# Patient Record
Sex: Female | Born: 1945 | Hispanic: Yes | Marital: Married | Smoking: Never smoker
Health system: Southern US, Community
[De-identification: ages and names within clinical notes are randomized; demographics above are authoritative.]

## PROBLEM LIST (undated history)

## (undated) DIAGNOSIS — E119 Type 2 diabetes mellitus without complications: Secondary | ICD-10-CM

## (undated) DIAGNOSIS — T7840XA Allergy, unspecified, initial encounter: Secondary | ICD-10-CM

## (undated) DIAGNOSIS — G8929 Other chronic pain: Secondary | ICD-10-CM

## (undated) DIAGNOSIS — M549 Dorsalgia, unspecified: Secondary | ICD-10-CM

## (undated) DIAGNOSIS — E785 Hyperlipidemia, unspecified: Secondary | ICD-10-CM

## (undated) DIAGNOSIS — I10 Essential (primary) hypertension: Secondary | ICD-10-CM

## (undated) DIAGNOSIS — M1990 Unspecified osteoarthritis, unspecified site: Secondary | ICD-10-CM

## (undated) HISTORY — DX: Allergy, unspecified, initial encounter: T78.40XA

## (undated) HISTORY — PX: CHOLECYSTECTOMY: SHX55

## (undated) HISTORY — DX: Hyperlipidemia, unspecified: E78.5

## (undated) HISTORY — DX: Type 2 diabetes mellitus without complications: E11.9

## (undated) HISTORY — DX: Essential (primary) hypertension: I10

## (undated) HISTORY — DX: Unspecified osteoarthritis, unspecified site: M19.90

---

## 2004-05-04 ENCOUNTER — Ambulatory Visit: Payer: Self-pay

## 2004-06-08 ENCOUNTER — Ambulatory Visit: Payer: Self-pay | Admitting: Unknown Physician Specialty

## 2011-05-29 ENCOUNTER — Ambulatory Visit: Payer: Self-pay | Admitting: Family Medicine

## 2012-06-12 ENCOUNTER — Ambulatory Visit: Payer: Self-pay | Admitting: Family Medicine

## 2012-07-01 ENCOUNTER — Ambulatory Visit: Payer: Self-pay | Admitting: Family Medicine

## 2012-10-14 ENCOUNTER — Emergency Department: Payer: Self-pay | Admitting: Emergency Medicine

## 2012-10-14 LAB — COMPREHENSIVE METABOLIC PANEL
Albumin: 3.5 g/dL (ref 3.4–5.0)
Alkaline Phosphatase: 121 U/L (ref 50–136)
Anion Gap: 5 — ABNORMAL LOW (ref 7–16)
BUN: 11 mg/dL (ref 7–18)
Bilirubin,Total: 0.3 mg/dL (ref 0.2–1.0)
Co2: 25 mmol/L (ref 21–32)
Creatinine: 0.66 mg/dL (ref 0.60–1.30)
EGFR (African American): >60
EGFR (Non-African Amer.): >60
Potassium: 4.2 mmol/L (ref 3.5–5.1)
SGOT(AST): 25 U/L (ref 15–37)
SGPT (ALT): 31 U/L (ref 12–78)

## 2012-10-14 LAB — URINALYSIS, COMPLETE
Bacteria: NONE SEEN
Bilirubin,UR: NEGATIVE
Blood: NEGATIVE
Glucose,UR: NEGATIVE mg/dL (ref 0–75)
Ph: 5 (ref 4.5–8.0)
Protein: NEGATIVE
RBC,UR: <1 /HPF (ref 0–5)
Specific Gravity: 1.006 (ref 1.003–1.030)
WBC UR: <1 /HPF (ref 0–5)

## 2012-10-14 LAB — CBC
MCH: 28.2 pg (ref 26.0–34.0)
RBC: 4.96 10*6/uL (ref 3.80–5.20)
RDW: 14.6 % — ABNORMAL HIGH (ref 11.5–14.5)

## 2013-01-07 ENCOUNTER — Ambulatory Visit: Payer: Self-pay

## 2013-04-27 ENCOUNTER — Ambulatory Visit: Payer: Self-pay | Admitting: Orthopedic Surgery

## 2013-05-13 ENCOUNTER — Ambulatory Visit: Payer: Self-pay | Admitting: Orthopedic Surgery

## 2013-05-13 LAB — CBC
HGB: 13.7 g/dL (ref 12.0–16.0)
MCV: 86 fL (ref 80–100)
Platelet: 322 10*3/uL (ref 150–440)
RBC: 4.71 10*6/uL (ref 3.80–5.20)
RDW: 14.7 % — ABNORMAL HIGH (ref 11.5–14.5)
WBC: 9.4 10*3/uL (ref 3.6–11.0)

## 2013-05-13 LAB — BASIC METABOLIC PANEL
Anion Gap: 6 — ABNORMAL LOW (ref 7–16)
BUN: 11 mg/dL (ref 7–18)
Calcium, Total: 9.2 mg/dL (ref 8.5–10.1)
Chloride: 105 mmol/L (ref 98–107)
Creatinine: 0.89 mg/dL (ref 0.60–1.30)
EGFR (African American): >60
EGFR (Non-African Amer.): >60
Osmolality: 279 (ref 275–301)

## 2013-05-13 LAB — URINALYSIS, COMPLETE
Bacteria: NONE SEEN
Bilirubin,UR: NEGATIVE
Blood: NEGATIVE
Leukocyte Esterase: NEGATIVE
Nitrite: NEGATIVE
Protein: 30
RBC,UR: <1 /HPF (ref 0–5)
Specific Gravity: 1.025 (ref 1.003–1.030)
Squamous Epithelial: 8

## 2013-05-13 LAB — SEDIMENTATION RATE: Erythrocyte Sed Rate: 6 mm/hr (ref 0–30)

## 2013-05-13 LAB — PROTIME-INR: Prothrombin Time: 13.3 secs (ref 11.5–14.7)

## 2013-05-26 ENCOUNTER — Inpatient Hospital Stay: Payer: Self-pay | Admitting: Orthopedic Surgery

## 2013-05-27 LAB — BASIC METABOLIC PANEL
Anion Gap: 3 — ABNORMAL LOW (ref 7–16)
BUN: 10 mg/dL (ref 7–18)
Calcium, Total: 8.1 mg/dL — ABNORMAL LOW (ref 8.5–10.1)
Co2: 26 mmol/L (ref 21–32)
EGFR (African American): >60
EGFR (Non-African Amer.): >60
Sodium: 136 mmol/L (ref 136–145)

## 2013-05-27 LAB — HEMOGLOBIN: HGB: 12.6 g/dL (ref 12.0–16.0)

## 2013-05-28 LAB — URINALYSIS, COMPLETE
Bacteria: NONE SEEN
Leukocyte Esterase: NEGATIVE
Nitrite: NEGATIVE
WBC UR: 1 /HPF (ref 0–5)

## 2013-05-28 LAB — PATHOLOGY REPORT

## 2013-05-30 LAB — URINE CULTURE

## 2013-06-01 ENCOUNTER — Ambulatory Visit: Payer: Self-pay | Admitting: Orthopedic Surgery

## 2014-01-07 IMAGING — CT CT OF THE RIGHT KNEE WITHOUT CONTRAST
1 of 3 series · 8 of 14 positions shown, 10 images · non-contrast
Comparison: none

REASON FOR EXAM: Right knee pain MY KNEE  Presurgical planning
COMMENTS:

PROCEDURE:     KCT - KCT KNEE RIGHT WITHOUT CONTRAST  - April 27, 2013  [DATE]
RESULT:     History: Knee pain. Preoperative evaluation.

[Series 3: knee 1.0 b30s · axial · 0.39mm/px · z∈[+368,+514]mm · 8 of 375 slices shown, 10 images]
[im 42/375  soft-tissue]
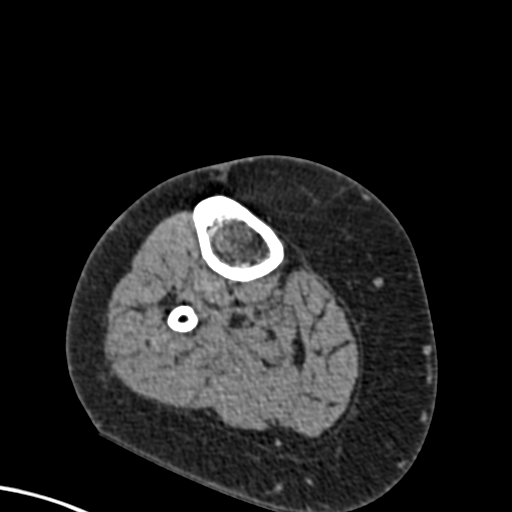
[im 42/375  bone]
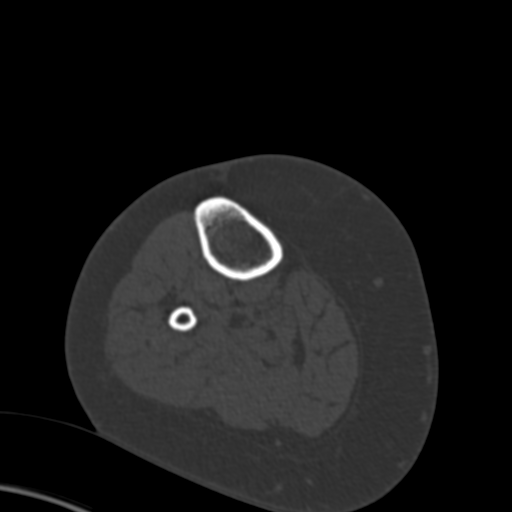
[im 84/375  bone]
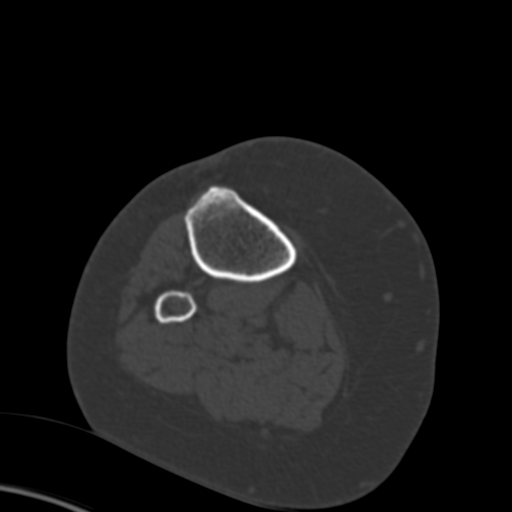
[im 125/375  bone]
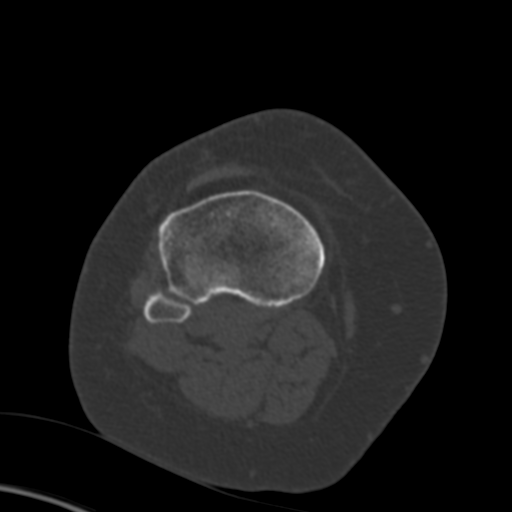
[im 167/375  bone]
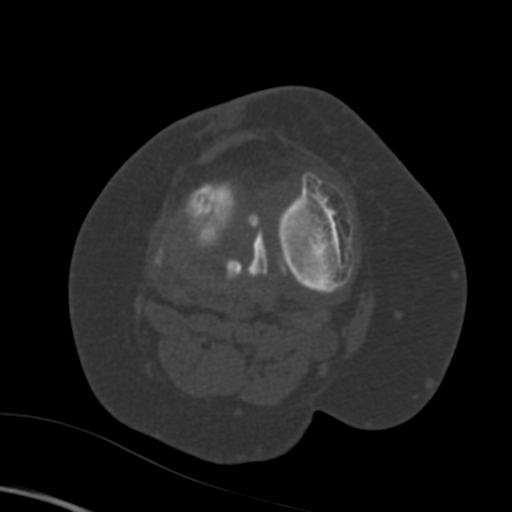
[im 208/375  soft-tissue]
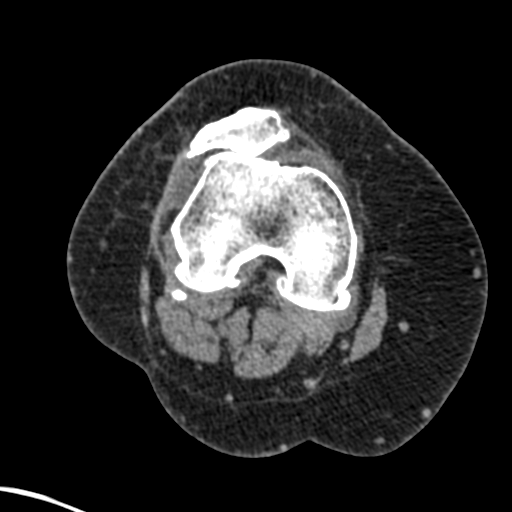
[im 208/375  bone]
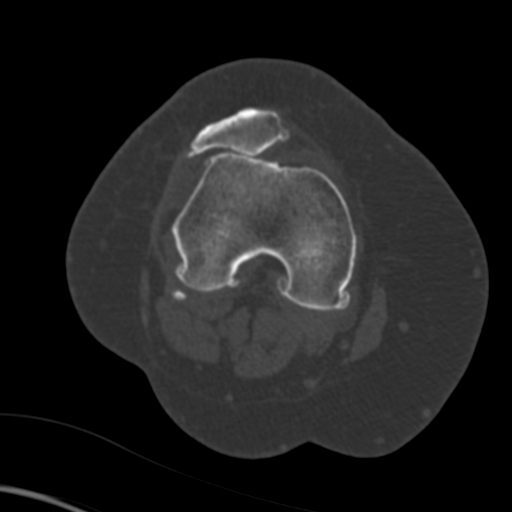
[im 250/375  bone]
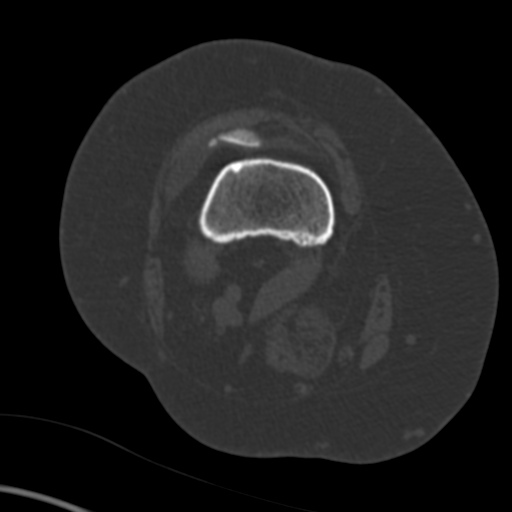
[im 291/375  bone]
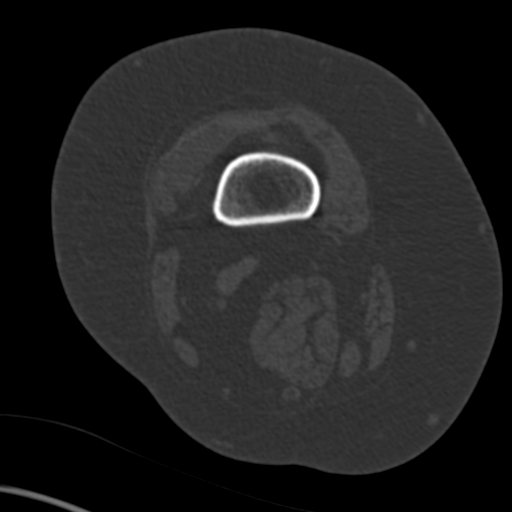
[im 333/375  bone]
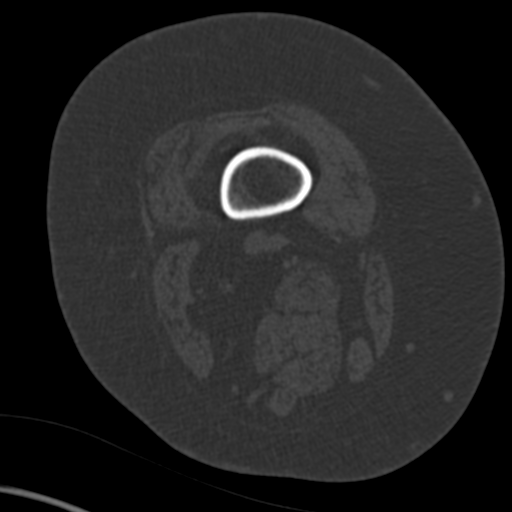

[8 of 14 positions shown; findings below may reference images not displayed]

FINDINGS: Standard preop CT right knee obtained. Lateral patellar subluxation is
present. Prominent degenerative change present. No focal bony lesion
identified.
IMPRESSION: Degenerative change right knee. No focal lesion.

## 2014-07-07 ENCOUNTER — Emergency Department: Payer: Self-pay | Admitting: Student

## 2014-07-07 LAB — COMPREHENSIVE METABOLIC PANEL
ALK PHOS: 122 U/L — AB
AST: 46 U/L — AB (ref 15–37)
Albumin: 3.4 g/dL (ref 3.4–5.0)
Anion Gap: 8 (ref 7–16)
BILIRUBIN TOTAL: 0.3 mg/dL (ref 0.2–1.0)
BUN: 10 mg/dL (ref 7–18)
CALCIUM: 9 mg/dL (ref 8.5–10.1)
CREATININE: 0.64 mg/dL (ref 0.60–1.30)
Chloride: 106 mmol/L (ref 98–107)
Co2: 25 mmol/L (ref 21–32)
EGFR (African American): >60
EGFR (Non-African Amer.): >60
GLUCOSE: 100 mg/dL — AB (ref 65–99)
Osmolality: 277 (ref 275–301)
Potassium: 4 mmol/L (ref 3.5–5.1)
SGPT (ALT): 53 U/L
SODIUM: 139 mmol/L (ref 136–145)
Total Protein: 7.9 g/dL (ref 6.4–8.2)

## 2014-07-07 LAB — CBC WITH DIFFERENTIAL/PLATELET
BASOS ABS: 0.1 10*3/uL (ref 0.0–0.1)
Basophil %: 0.6 %
EOS PCT: 2.7 %
Eosinophil #: 0.3 10*3/uL (ref 0.0–0.7)
HCT: 42.5 % (ref 35.0–47.0)
HGB: 13.7 g/dL (ref 12.0–16.0)
LYMPHS PCT: 30.8 %
Lymphocyte #: 3.2 10*3/uL (ref 1.0–3.6)
MCH: 28 pg (ref 26.0–34.0)
MCHC: 32.2 g/dL (ref 32.0–36.0)
MCV: 87 fL (ref 80–100)
Monocyte #: 0.7 x10 3/mm (ref 0.2–0.9)
Monocyte %: 6.6 %
Neutrophil #: 6.2 10*3/uL (ref 1.4–6.5)
Neutrophil %: 59.3 %
PLATELETS: 339 10*3/uL (ref 150–440)
RBC: 4.9 10*6/uL (ref 3.80–5.20)
RDW: 14.3 % (ref 11.5–14.5)
WBC: 10.4 10*3/uL (ref 3.6–11.0)

## 2014-07-07 LAB — HEMOGLOBIN A1C: Hemoglobin A1C: 7.1 % — ABNORMAL HIGH (ref 4.2–6.3)

## 2014-07-18 ENCOUNTER — Emergency Department: Payer: Self-pay | Admitting: Emergency Medicine

## 2014-10-26 ENCOUNTER — Ambulatory Visit
Admit: 2014-10-26 | Disposition: A | Discharge status: Home / Self Care | Payer: Self-pay | Attending: Orthopedic Surgery | Admitting: Orthopedic Surgery

## 2014-10-26 LAB — URINALYSIS, COMPLETE
BLOOD: NEGATIVE
Bilirubin,UR: NEGATIVE
Glucose,UR: NEGATIVE mg/dL (ref 0–75)
Ketone: NEGATIVE
LEUKOCYTE ESTERASE: NEGATIVE
NITRITE: NEGATIVE
PROTEIN: NEGATIVE
Ph: 5 (ref 4.5–8.0)
RBC,UR: 1 /HPF (ref 0–5)
SPECIFIC GRAVITY: 1.021 (ref 1.003–1.030)
Squamous Epithelial: 6

## 2014-10-26 LAB — BASIC METABOLIC PANEL
Anion Gap: 9 (ref 7–16)
BUN: 17 mg/dL
Calcium, Total: 9.3 mg/dL
Chloride: 102 mmol/L
Co2: 27 mmol/L
Creatinine: 0.53 mg/dL
EGFR (African American): >60
EGFR (Non-African Amer.): >60
Glucose: 123 mg/dL — ABNORMAL HIGH
Potassium: 3.9 mmol/L
Sodium: 138 mmol/L

## 2014-10-26 LAB — MRSA PCR SCREENING

## 2014-10-26 LAB — CBC
HCT: 41.4 % (ref 35.0–47.0)
HGB: 13.5 g/dL (ref 12.0–16.0)
MCH: 28.1 pg (ref 26.0–34.0)
MCHC: 32.7 g/dL (ref 32.0–36.0)
MCV: 86 fL (ref 80–100)
Platelet: 319 10*3/uL (ref 150–440)
RBC: 4.82 10*6/uL (ref 3.80–5.20)
RDW: 15 % — AB (ref 11.5–14.5)
WBC: 9.8 10*3/uL (ref 3.6–11.0)

## 2014-10-26 LAB — PROTIME-INR
INR: 1.1
Prothrombin Time: 13.9 secs

## 2014-10-26 LAB — APTT: Activated PTT: 33.8 secs (ref 23.6–35.9)

## 2014-10-26 LAB — SEDIMENTATION RATE: Erythrocyte Sed Rate: 16 mm/hr (ref 0–30)

## 2014-10-28 LAB — URINE CULTURE

## 2014-11-02 ENCOUNTER — Inpatient Hospital Stay
Admit: 2014-11-02 | Disposition: A | Discharge status: Home / Self Care | Payer: Self-pay | Attending: Orthopedic Surgery | Admitting: Orthopedic Surgery

## 2014-11-02 HISTORY — PX: JOINT REPLACEMENT: SHX530

## 2014-11-03 LAB — BASIC METABOLIC PANEL
Anion Gap: 3 — ABNORMAL LOW (ref 7–16)
BUN: 9 mg/dL
CREATININE: 0.55 mg/dL
Calcium, Total: 8.2 mg/dL — ABNORMAL LOW
Chloride: 105 mmol/L
Co2: 26 mmol/L
EGFR (African American): >60
EGFR (Non-African Amer.): >60
GLUCOSE: 183 mg/dL — AB
Potassium: 3.7 mmol/L
SODIUM: 134 mmol/L — AB

## 2014-11-03 LAB — PLATELET COUNT: PLATELETS: 296 10*3/uL (ref 150–440)

## 2014-11-03 LAB — HEMOGLOBIN: HGB: 12.6 g/dL (ref 12.0–16.0)

## 2014-11-06 LAB — WOUND CULTURE

## 2014-11-12 NOTE — Op Note (Signed)
PATIENT NAME:  Hayley Mitchell, Hayley Mitchell MR#:  [id number] DATE OF BIRTH:  [birth year]  DATE OF PROCEDURE:  05/26/2013  PREOPERATIVE DIAGNOSIS: Right knee severe osteoarthritis.   POSTOPERATIVE DIAGNOSIS: Right knee severe osteoarthritis.   PROCEDURE: Right total knee replacement.   SURGEON: Kennedy BuckerMichael Velmer Woelfel, MD  ASSISTANT: Cranston Neighborhris Gaines, PA-Mitchell  DESCRIPTION OF PROCEDURE: The patient was brought to the Operating Room, and after adequate spinal anesthesia was obtained, the right leg was prepped and draped in the usual sterile fashion, with a tourniquet applied to the upper thigh. After patient identification and timeout procedures were completed, the leg was prepped and draped in the usual sterile fashion, appropriate patient identification, timeout procedures were completed, the leg was exsanguinated with an Esmarch, and the tourniquet raised to 300 mmHg. A midline skin incision was made, followed by a medial parapatellar arthrotomy. Inspection reveals severe degenerative changes throughout the knee, particularly medially, with exposed bone on both femoral and tibial condyles, as well as extensive eburnation of the patella, with significant bone loss. The ACL and fat pad were excised. The proximal tibia was exposed to allow for application of the Medacta cutting block. Cutting block applied and proximal tibia cut carried out. The identical procedure was carried out on the femoral side with the Medacta femoral cutting guide applied, and distal femoral cut made. A size 2 cutting guide was then placed on the distal femur. Anterior, posterior and chamfer cuts made. The size 2 tibia trial was placed after excising the posterior horns of the menisci and pinned in place. A proximal drill hole made along with the keel punch, which was left in place for trialing. The 2 femur was impacted into place with distal femoral drill holes made. The 10 mm insert gave good stability throughout a range of motion. The trials were removed,  and the guide for the trochlear cut was then placed and the trochlear cut made in the distal femur. The patella was cut using the patellar cutting guide. It was quite small, so a round drill guide was used for a 24 mm patella. This was drilled and trial fit well. At this point, the tourniquet was let down and hemostasis checked with electrocautery. The periarticular tissue was infiltrated with a combination of Marcaine with morphine as well as a dilute Exparel around the joint. The knee was thoroughly irrigated after raising the tourniquet. After the bony surfaces were dried, the tibial component was cemented into place first, with excess cement removed. The femoral component cemented into place with a 10 mm trial placed, followed by cementing the patellar component. After all the cement had set, the excess cement was removed and the 10 mm final implant was placed. The patella tracked well, with no touch technique. Tourniquet was let down and the knee again thoroughly irrigated. The arthrotomy was repaired using a heavy quill suture, 2-0 quill subcutaneously,  skin staples, Xeroform, 4 x 4's, ABD, Webril, and Ace wrap, along with Polar Care and knee immobilizer.   ESTIMATED BLOOD LOSS: 100.  COMPLICATIONS:  None.   SPECIMEN:  Cut ends of bone.   TOURNIQUET TIME:  65 minutes at 300 mmHg.  IMPLANTS:  Medacta GMK sphere primary size 2 right, tibial tray size 2 right, a 2 right 10 mm flex tibial insert, and a 24 mm inset patella.     ____________________________ Leitha SchullerMichael J. Adlai Sinning, MD mjm:mr D: 05/26/2013 19:36:40 ET T: 05/26/2013 19:51:24 ET JOB#: [id number]  cc: Leitha SchullerMichael J. Jenica Costilow, MD, <Dictator> Leitha SchullerMICHAEL J Braydyn Schultes MD ELECTRONICALLY SIGNED 05/27/2013  7:47 

## 2014-11-12 NOTE — Discharge Summary (Signed)
PATIENT NAME:  Hayley Mitchell, Hayley Mitchell MR#:  [id number] DATE OF BIRTH:  January 06, 1946  DATE OF ADMISSION:  05/26/2013 DATE OF DISCHARGE:  05/30/2013   ADMITTING DIAGNOSIS: Right knee severe osteoarthritis.   DISCHARGE DIAGNOSIS: Right knee severe osteoarthritis.   PROCEDURE: Right total knee replacement.   SURGEON: Leitha SchullerMichael J. Menz, M.D.   ASSISTANT: Cranston Neighborhris Yarimar Lavis, PA-Mitchell.   ESTIMATED BLOOD LOSS: 100 mL.   COMPLICATIONS: None.   SPECIMEN: Cut ends of bone.   TOURNIQUET TIME: 65 minutes at 300 mmHg.  IMPLANTS: Medacta GMK Sphere Primary size 2 right, tibial tray size 2 right,  a 2 right 10 mm flexible tibial insert, and a 24 mm insert patella.   CONDITION: To recovery room stable.   HISTORY: The patient is a 69 year old female with severe right knee osteoarthritis. She has had extensive nonoperative treatment that has failed. She has pain at night and difficulty with doing activities of daily living such as going to the grocery store. Her MyKnee CT was reviewed as well as our plan with interpreter present.   PHYSICAL EXAMINATION: LUNGS: Clear.  HEART: Regular rate and rhythm.  HEENT: On exam today, she has partial upper dentures.  RIGHT KNEE: On examination of right knee, the patient has 5 to 110 degrees of range of motion with a large Baker's cyst present. She has crepitation laterally and medially as well as patellofemoral joint pain. Distally, she was neurovascularly intact with trace edema, palpable dorsalis pedis pulse and posterior tibialis pulse. Able to flex and extend the toes.   HOSPITAL COURSE: The patient was admitted to the hospital on 05/26/2013. She had surgery that same day and was brought to the orthopedic floor from the PACU in stable condition. On postop day 1, the patient was found to have severe pain and moderate nausea. She had vomited a few times throughout the night. She was started on Zofran as well as Reglan and increased her pain medication by adding Ultram 50 mg 1 to 2  tablets every 4 to 6 hours as needed for pain. On postop day 2, the patient's pain and nausea were significantly improved. She was able to tolerate p.o. She was doing well. On postop day 3, the patient was doing much better as far as her pain. She was tolerating p.o. well. Vital signs and labs were stable. She was progressing very well with physical therapy by postop day 3 and was able to ambulate 150 feet as well as a few stairs. She had had a bowel movement. On postoperative day 4, the patient was stable and ready for discharge to home with home health PT.    CONDITION AT DISCHARGE: Stable.   DISCHARGE INSTRUCTIONS: The patient may gradually increase weight-bearing on the affected extremity. She is to elevate the affected foot on 1 or 2 pillows with the foot higher than the knee. Thigh-high TED hose on both legs and remove at bedtime and replace on arising the next morning. Elevate the heels off the bed. Use incentive spirometry every hour while awake. Encourage cough and deep breathing. No concentrated sweets or sugar. Continue using Polar Care unit, maintaining a temperature between 40 and 50 degrees. Do not get the dressing or bandage wet or dirty. [facility] if the dressing gets water under it. Leave the dressing on. Call [facility] if any of the following occur: Bright red bleeding from the incision or wound, fever above 101.5 degrees, redness, swelling or drainage at the incision. [facility] if you experience any  increased leg pain, numbness or weakness in your legs, bowel or bladder symptoms. Home physical therapy has been arranged for continuation of rehab program. Please call [facility] if a therapist has not contacted you within 48 hours of your return home. Call [facility] for followup appointment.   DISCHARGE MEDICATIONS: Duloxetine 30 mg oral delayed-release capsule 1 cap orally once a day in the evening, metformin 500 mg oral tablet 1 tablet orally 2 times a day, Tylenol 500 mg oral tablet 1  tablet orally every 4 hours as needed for pain or temperature greater than 100.4, oxycodone 5 mg 1 tablet orally every 4 hours as needed for pain, tramadol 50 mg 1 tablet orally every 6 hours as needed for pain, magnesium hydroxide 8% oral suspension 30 mL orally 2 times a day as needed for constipation, milk of magnesia 30 mL orally every 6 hours as needed for indigestion or heartburn, Lovenox 40 mg subcutaneous once a day for 14 days, ondansetron 4 mg oral tablet 1 tablet orally every 6 hours as needed for nausea or vomiting, aspirin 81 mg oral tablet chewable 1 tablet orally once a day, bisacodyl 10 mg rectal suppository 1 suppository rectally once a day as needed for constipation, docusate/senna 50 mg/8.6 mg oral tablet 1 tablet orally 2 times a day.   ____________________________ T. Cranston Neighbor, PA-Mitchell tcg:jcm D: 05/29/2013 13:38:41 ET T: 05/29/2013 14:02:07 ET JOB#: [id number]  cc: T. Cranston Neighbor, PA-Mitchell, <Dictator> Evon Slack [place] ELECTRONICALLY SIGNED 06/11/2013 13:17

## 2014-11-21 NOTE — Op Note (Signed)
PATIENT NAME:  Hayley Mitchell, Hayley Mitchell MR#:  [id number] DATE OF BIRTH:  16-Aug-1945  DATE OF PROCEDURE:  11/02/2014  PREOPERATIVE DIAGNOSIS:  Unstable right total knee.   POSTOPERATIVE DIAGNOSIS:  Unstable right total knee.   PROCEDURE:  Revision polyethylene insert, right total knee.   ANESTHESIA:  Spinal.   SURGEON:  Leitha SchullerMichael J. Kathren Scearce, MD   ASSISTANT:  Cranston Neighborhris Gaines, PA-Mitchell   DESCRIPTION OF PROCEDURE:  The patient was brought to the operating room, and after adequate spinal anesthesia was obtained, the right leg was prepped and draped in the usual sterile fashion with a bump underneath the right buttock to internally rotate the leg. After prepping and draping in the usual sterile fashion, appropriate patient identification and timeout procedures were completed. The tourniquet was raised to 300 mmHg, and a midline skin incision was made followed by a medial parapatellar arthrotomy. Culture was obtained at this point. Fluid was clear. The medial and lateral gutters were checked, and there was no significant synovitis. The scar behind the patellar tendon was removed to adequately mobilize the anterior knee and expose the prior tibial insert. There was stability in mid flexion and extension to varus and valgus. The screw was removed and the implant then removed with the use of a small osteotome. There was some wear on the medial compartment consistent with instability. Trialing with different sized inserts was made, and a 14 mm insert gave excellent stability in flexion, extension, and full extension. This was chosen as the final insert and this was placed and set screw inserted. The knee was then infiltrated with Exparel and irrigated with first dilute Betadine solution and then pulsatile lavage. The arthrotomy was repaired using Ethibond adjacent to the patella and then a heavy quill suture, 2-0 quill subcutaneously, and skin staples. Xeroform, 4 x 4's, ABD, Webril and Ace wrap were applied along with a Polar  Care device. The patient was sent to the recovery room in stable condition.   TOURNIQUET TIME:  35 minutes.   ESTIMATED BLOOD LOSS:  25.   COMPLICATIONS:  None.   SPECIMEN:  Culture.  CONDITION TO RECOVERY ROOM:  Stable.    ____________________________ Leitha SchullerMichael J. Thierry Dobosz, MD mjm:nb D: 11/02/2014 20:33:41 ET T: 11/03/2014 01:17:22 ET JOB#: [id number]  cc: Leitha SchullerMichael J. Kaitlynne Wenz, MD, <Dictator> Leitha SchullerMICHAEL J Sameen Leas MD ELECTRONICALLY SIGNED 11/03/2014 8:37

## 2014-11-21 NOTE — Discharge Summary (Signed)
PATIENT NAME:  Hayley Mitchell, Hayley Mitchell MR#:  [id number] DATE OF BIRTH:  [birth year]  DATE OF ADMISSION:  11/02/2014 DATE OF DISCHARGE:  11/05/2014  ADMITTING DIAGNOSIS: Unstable right total knee.   DISCHARGE DIAGNOSIS: Unstable right total knee.   PROCEDURE: Revision of polyethylene insert, right total knee.   ANESTHESIA: Spinal.   SURGEON: Leitha Schuller, MD   ASSISTANT: Lollie Marrow, PA-C.   ESTIMATED BLOOD LOSS: 25 mL.   COMPLICATIONS: None.   SPECIMEN: Culture.   CONDITION: To recovery room stable.   HISTORY OF PRESENT ILLNESS: The patient is a 69 year old female seen for evaluation of right total knee, complains of pain and instability. Revision procedure was performed 05/26/2013. She did not have any wound healing problems. She did have a pain-free period after the replacement. Symptoms started several months ago. Symptoms are gradual in onset. She has had no new injury or accident. She reports not 9/10 pain. Pain is better with nonsteroidal anti-inflammatory drugs. The pain is located lateral and medially. The pain is described as aching. The symptoms are aggravated with normal daily activities. She has mechanical symptoms. She has associated swelling and tenderness. She has tried nonsteroidal anti-inflammatory drugs, ibuprofen.  PHYSICAL EXAMINATION: GENERAL: No apparent distress, well nourished, well developed, obese.  EYES: Pupils equal, round, and reactive, synchronous movement.  LYMPHATIC: No palpable adenopathy.  RESPIRATORY: Nonlabored breathing.  LUNGS: Clear to auscultation.  VASCULAR: No edema.  NEUROLOGIC: Normal mood and affect.  MUSCULOSKELETAL: Normal, except as noted in right knee exam.  HEART: Regular rate and rhythm.  HEENT: Normal.  RIGHT KNEE: Shows the patient has midline incision that is healed with no signs of infection. She has mild swelling, but no warmth or erythema. She is tender along the medial joint line and lateral joint line. Range of motion is  full. She has grade 2 pain with varus and valgus stress testing.  NEUROLOGICAL: Normal.  VASCULAR: Normal.   HOSPITAL COURSE: The patient was admitted to the hospital on 11/02/2014. She had surgery that same day and was brought to the orthopedic floor from the PACU in stable condition. On postoperative day 1, hemoglobin was normal at 12.6. Other labs and vital signs were stable and normal. She was having moderate pain and slow progress in physical therapy. On postoperative day 2, she did slightly progress therapy and was able to stand and ambulate to the chair. Pain continued to be moderate. By postoperative day 3, the patient made minimal improvements with physical therapy, but was very insistent on going home with home health physical therapy. Daughters were also insistent that the patient return home, as she had numerous caretakers at home that could help assist her. On postoperative day 3, the patient was stable and ready for discharge to home with home health physical therapy.   CONDITION AT DISCHARGE: Stable.   DISCHARGE INSTRUCTIONS: The patient may gradually increase weight-bearing on the affected extremity. Elevate the affected foot or leg on 1 or 2 pillows with the foot higher than the knee. Thigh-high TED hose on both legs and remove at bedtime, replace on arising the next morning. Elevate the heels off the bed. Incentive spirometer every hour while awake. Encourage cough and deep breathing. The patient may resume a regular diet as tolerated. Continue using Polar Care unit, maintaining temperature between 40 and 50 degrees. Do not get the dressing or bandage wet or dirty. Call [facility] if the dressing gets water under it. Leave the dressing on. Call [facility] if  any of the following occur: Bright red bleeding from the incisional wound, fever above 105 degrees, redness, swelling, or drainage at the incisions. Call [facility] if you experience  any increased leg pain, numbness, or weakness in the legs, or bowel or bladder symptoms. Home physical therapy has been arranged for continuation of rehabilitation program. Please call [facility] if a therapist has not contacted you within 48 hours of your return home. [facility] for follow-up appointment. [facility].   DISCHARGE MEDICATIONS: Please see list of discharge medications from discharge instructions.     ____________________________ T. Cranston Neighborhris Gaines, PA-C tcg:mw D: 11/05/2014 08:28:31 ET T: 11/05/2014 14:20:17 ET JOB#: [id number]  cc: T. Cranston Neighborhris Gaines, PA-C, <Dictator> Evon SlackHOMAS C GAINES [place] ELECTRONICALLY SIGNED 11/18/2014 14:18

## 2014-12-06 ENCOUNTER — Ambulatory Visit: Payer: Medicare Other | Attending: Orthopedic Surgery

## 2014-12-06 DIAGNOSIS — R269 Unspecified abnormalities of gait and mobility: Secondary | ICD-10-CM | POA: Diagnosis not present

## 2014-12-06 DIAGNOSIS — M25561 Pain in right knee: Secondary | ICD-10-CM | POA: Diagnosis not present

## 2014-12-06 NOTE — Therapy (Signed)
[facility] MAIN [facility] SERVICES [address] [place], [place], [address] Phone: [phone]   Fax:  [phone]  Physical Therapy Evaluation  Patient Details  Name: Hayley Mitchell MRN: [id number] Date of Birth: 16-Oct-1945 Referring Provider:  Kennedy Bucker, MD  Encounter Date: 12/06/2014      PT End of Session - 12/06/14 1250    Visit Number 1   Number of Visits 9   Date for PT Re-Evaluation 01/03/15   PT Start Time 1105   PT Stop Time 1155   PT Time Calculation (min) 50 min   Equipment Utilized During Treatment Gait belt   Activity Tolerance Patient limited by pain   Behavior During Therapy [facility] for tasks assessed/performed      Past Medical History  Diagnosis Date  . Allergy     pt denies any significant medical history to herself or family, no meds.    Past Surgical History  Procedure Laterality Date  . Cholecystectomy    . Joint replacement Left November 02, 2014    There were no vitals filed for this visit.  Visit Diagnosis:  Right knee pain - Plan: PT plan of care cert/re-cert  Abnormality of gait - Plan: PT plan of care cert/re-cert      Subjective Assessment - 12/06/14 1115    Subjective pt reports having an original surgery R TKA Jun 05 2013, she reports at first it was doing good, then she started to notice pain about 6 months ago, walking was unstable and hurting. she went back to the Dr. Rosita Kea where he preformed a second TKA April. 18 2016. pt reports going home after surgery and  recieving home health PT for 6 visits. she reports she went to outpatient PT at [facility] for 2 visits, but then got a phone call to come to outpatient [facility] PT.    Patient Stated Goals reduce pain and return to walking without pain   Currently in Pain? Yes   Pain Score 5    Pain Location Knee   Pain Orientation Right   Pain Descriptors / Indicators Aching   Aggravating Factors  standing / walking   Pain Relieving Factors meds, ice              [facility] PT Assessment - 12/06/14 1243    Assessment   Medical Diagnosis R TKA   Onset Date 11/08/14   Prior Therapy HH PT   Precautions   Precautions None   Restrictions   Weight Bearing Restrictions No   Balance Screen   Has the patient fallen in the past 6 months No   Has the patient had a decrease in activity level because of a fear of falling?  Yes   Is the patient reluctant to leave their home because of a fear of falling?  No   Home Environment   Living Enviornment Private residence   Living Arrangements Children   Home Access Stairs to enter   Entrance Stairs-Number of Steps 3   Entrance Stairs-Rails Can reach both   Home Equipment Walker - 2 wheels;Tub bench;Grab bars - tub/shower;Cane - single point   Prior Function   Level of Independence Independent with basic ADLs;Independent with homemaking with ambulation   Observation/Other Assessments   Observations in no acute distress, pt is obese   Skin Integrity well healing scar   Sensation   Light Touch --  reduced sensation R lateral knee   ROM / Strength   AROM / PROM /  Strength AROM;Strength   AROM   Overall AROM  Deficits   Overall AROM Comments --  L knee 0-120 deg, R knee 0-110 deg   Strength   Overall Strength Deficits   Overall Strength Comments L hip flexion 4/5, knee ext 4+/5, hamstrings 4+/5, hip abd 4/5, hip adduction 4-/5. R hip flexion 3+/5, knee ext 4-/5, hamstrsings 4/5, hip abd 4-/5, hip adduction 4-/5   Bed Mobility   Bed Mobility --  WNL   Transfers   Transfers --  independent   Ambulation/Gait   Ambulation/Gait Yes   Ambulation/Gait Assistance 6: Modified independent (Device/Increase time)   Ambulation Distance (Feet) --  3626m   Assistive device Straight cane   Gait Pattern Step-through pattern;Decreased step length - right;Decreased step length - left;Decreased stance time - right  antalgic R, pt orig. used cane on R side, educated to use L   Ambulation Surface Level   Gait  velocity 0.23 m/s                           PT Education - 12/06/14 1250    Education provided Yes   Education Details educated to continue HEP, she has been compliant. use ice as needed.    Person(s) Educated Patient   Methods Explanation   Comprehension Verbalized understanding             PT Long Term Goals - 12/06/14 1407    PT LONG TERM GOAL #1   Title pt will ascend/descend 4 steps in step over step pattern using 1 handrail demonstrating improved RLE strength.    Time 4   Period Weeks   Status New   PT LONG TERM GOAL #2   Title pt will squat to pick up item from the ground 3x demonstrating return to ADLs.    Time 4   Period Weeks   Status New   PT LONG TERM GOAL #3   Title pt will increase gait speed to 1.3832m/s demonstrating normalized home mobility.    Time 4   Period Weeks   Status New   PT LONG TERM GOAL #4   Title pt will ambulate 5500ft with LRAD without rest demonstrating community mobility.    Time 4   Period Weeks   Status New               Plan - 12/06/14 1251    Clinical Impression Statement pt presents doing well s/p R TKA preformed 11/08/14. pt does c/o post-op pain, but is also out of pain meds, instructed pt to call MD for refill. pt demonstrates good post-op ROM, but demonstrates reduced strength, reduced mobility, impaired gait, pain secondary to her TKA.    Pt will benefit from skilled therapeutic intervention in order to improve on the following deficits Abnormal gait;Decreased activity tolerance;Decreased balance;Decreased mobility;Decreased strength;Pain;Decreased scar mobility;Decreased knowledge of use of DME;Difficulty walking;Decreased range of motion;Impaired flexibility   Rehab Potential Good   PT Frequency 2x / week   PT Duration 4 weeks   PT Treatment/Interventions DME Instruction;Balance training;Manual techniques;Therapeutic exercise;Electrical Stimulation;Cryotherapy;Therapeutic activities;Dry needling;Passive  range of motion;Patient/family education;Scar mobilization;Ultrasound;ADLs/Self Care Home Management;Gait training;Neuromuscular re-education   PT Next Visit Plan 6 min walk          G-Codes - 12/06/14 1411    Functional Limitation Mobility: Walking and moving around   Mobility: Walking and Moving Around Current Status ([id number]) At least 40 percent but less than 60 percent impaired, limited or restricted  Mobility: Walking and Moving Around Goal Status [phone]) At least 1 percent but less than 20 percent impaired, limited or restricted       Problem List There are no active problems to display for this patient.  Carlyon ShadowAshley C. Sherrye Puga, PT, DPT [phone]  Taheem Fricke 12/06/2014, 2:14 PM  [facility] MAIN [facility] [address] [place], [place], [address] Phone: [phone]   Fax:  [phone]

## 2014-12-09 ENCOUNTER — Ambulatory Visit: Payer: Medicare Other

## 2014-12-09 DIAGNOSIS — M25561 Pain in right knee: Secondary | ICD-10-CM | POA: Diagnosis not present

## 2014-12-09 DIAGNOSIS — R269 Unspecified abnormalities of gait and mobility: Secondary | ICD-10-CM

## 2014-12-09 NOTE — Therapy (Signed)
[facility] [address] [place], [place], [address] Phone: [phone]   Fax:  [phone]  Physical Therapy Treatment  Patient Details  Name: Johnna AcostaMaria C Hegarty MRN: [id number] Date of Birth: 1946/04/02 Referring Provider:  Kennedy BuckerMenz, Michael, MD  Encounter Date: 12/09/2014      PT End of Session - 12/09/14 1242    Visit Number 2   Number of Visits 9   Date for PT Re-Evaluation 01/03/15   PT Start Time 1105   PT Stop Time 1155   PT Time Calculation (min) 50 min   Equipment Utilized During Treatment Gait belt   Activity Tolerance Patient tolerated treatment well;Patient limited by pain  pt required multiple rest breaks   Behavior During Therapy [facility] for tasks assessed/performed      Past Medical History  Diagnosis Date  . Allergy     pt denies any significant medical history to herself or family, no meds.    Past Surgical History  Procedure Laterality Date  . Cholecystectomy    . Joint replacement Left November 02, 2014    There were no vitals filed for this visit.  Visit Diagnosis:  Right knee pain  Abnormality of gait      Subjective Assessment - 12/09/14 1234    Subjective pt reports she is doing better. pt reports her L knee holds her back sometimes. pt reports she is compliant with HEP. pt reports she left a message with MD to refill pain meds and has not heard back yet.   Patient is accompained by: Family member;Interpreter  granddaughter and interpreter   Patient Stated Goals reduce pain and return to walking without pain   Currently in Pain? Yes   Pain Score 5    Pain Location Knee   Pain Orientation Right;Left   Pain Descriptors / Indicators Aching            [facility] PT Assessment - 12/09/14 0001    6 Minute Walk- Baseline   6 Minute Walk- Baseline yes  38925ft                     [facility] Adult PT Treatment/Exercise - 12/09/14 0001    Ambulation/Gait   Ambulation/Gait Yes    Ambulation/Gait Assistance 6: Modified independent (Device/Increase time);5: Supervision   Ambulation/Gait Assistance Details --  38525ft for 6 min walk   Assistive device Straight cane   Gait Pattern Step-through pattern   Ambulation Surface Level   Gait Comments --  antalgic on the L>R   High Level Balance   High Level Balance Comments standing Narrow BOS 30s x 5 with eyes closed   Exercises   Exercises Knee/Hip   Knee/Hip Exercises: Standing   Forward Step Up 10 reps;Hand Hold: 2;Step Height: 6";Right   Functional Squat 2 sets;10 reps   Other Standing Knee Exercises --  4 way hip x 10 each way bilaterally open hand hold   Knee/Hip Exercises: Seated   Long Arc Quad AROM;Right;Left;2 sets;10 reps   Other Seated Knee Exercises --  seated march x 20   Other Seated Knee Exercises --  Nustep x 2 min no charge warm up   Modalities   Modalities Cryotherapy   Cryotherapy   Number Minutes Cryotherapy 10 Minutes   Cryotherapy Location Knee  bilateral   Type of Cryotherapy Ice pack                PT Education - 12/09/14 1242  Education provided Yes   Education Details prognosis, expected pain post-op   Person(s) Educated Patient   Methods Explanation   Comprehension Verbalized understanding             PT Long Term Goals - 12/09/14 1244    PT LONG TERM GOAL #1   Title pt will ascend/descend 4 steps in step over step pattern using 1 handrail demonstrating improved RLE strength.    Time 4   Period Weeks   Status New   PT LONG TERM GOAL #2   Title pt will squat to pick up item from the ground 3x demonstrating return to ADLs.    Time 4   Period Weeks   Status New   PT LONG TERM GOAL #3   Title pt will increase gait speed to 1.7268m/s demonstrating normalized home mobility.    Time 4   Period Weeks   Status New   PT LONG TERM GOAL #4   Title pt will ambulate 57100ft with LRAD without rest demonstrating community mobility.    Time 4   Period Weeks   Status New                Plan - 12/09/14 1243    Clinical Impression Statement pt did fair with progression of activity. pt has good motivation, but is limited by pain needin multiple rest breaks. pt reports her L knee is as painful as the R. 36925ft on 6 min walk test today- goal for 59300ft in 4 weeks for community mobility. pt had good pain relief with ice following therex.    Pt will benefit from skilled therapeutic intervention in order to improve on the following deficits Abnormal gait;Decreased activity tolerance;Decreased balance;Decreased mobility;Decreased strength;Pain;Decreased scar mobility;Decreased knowledge of use of DME;Difficulty walking;Decreased range of motion;Impaired flexibility   Rehab Potential Good   PT Frequency 2x / week   PT Duration 4 weeks   PT Treatment/Interventions DME Instruction;Balance training;Manual techniques;Therapeutic exercise;Electrical Stimulation;Cryotherapy;Therapeutic activities;Dry needling;Passive range of motion;Patient/family education;Scar mobilization;Ultrasound;ADLs/Self Care Home Management;Gait training;Neuromuscular re-education   PT Next Visit Plan progress strengthening.         Problem List There are no active problems to display for this patient.  Carlyon ShadowAshley C. Kyvon Hu, PT, DPT [phone]#[id number]  Aikam Vinje 12/09/2014, 12:56 PM  [facility] [address] [place], [place], [address] Phone: [phone]   Fax:  [phone]

## 2014-12-13 ENCOUNTER — Ambulatory Visit: Payer: Medicare Other

## 2014-12-13 DIAGNOSIS — M25561 Pain in right knee: Secondary | ICD-10-CM

## 2014-12-13 DIAGNOSIS — R269 Unspecified abnormalities of gait and mobility: Secondary | ICD-10-CM

## 2014-12-13 NOTE — Therapy (Signed)
[place] [address] [place], [place], [address] Phone: [phone]   Fax:  [phone]  Physical Therapy Treatment  Patient Details  Name: Hayley AcostaMaria C Mitchell MRN: [id number] Date of Birth: 04-08-1946 Referring Provider:  Kennedy BuckerMenz, Michael, MD  Encounter Date: 12/13/2014      PT End of Session - 12/13/14 1335    Visit Number 3   Number of Visits 9   Date for PT Re-Evaluation 01/03/15   PT Start Time 1120   PT Stop Time 1208   PT Time Calculation (min) 48 min   Equipment Utilized During Treatment Gait belt   Activity Tolerance Patient tolerated treatment well;Patient limited by pain  pt required multiple rest breaks   Behavior During Therapy [facility] for tasks assessed/performed      Past Medical History  Diagnosis Date  . Allergy     pt denies any significant medical history to herself or family, no meds.    Past Surgical History  Procedure Laterality Date  . Cholecystectomy    . Joint replacement Left November 02, 2014    There were no vitals filed for this visit.  Visit Diagnosis:  Right knee pain  Abnormality of gait      Subjective Assessment - 12/13/14 1325    Subjective pt reports she was sore after last visit, but she is doing all she can at home. pt reports she still does not have pain meds and is only taking Ibuprophen as the MD office has not returned her call. pt reports she she pleased to report she walked down to PT today instead of using a wheelchair.    Patient is accompained by: Family member;Interpreter  granddaughter and interpreter   Patient Stated Goals reduce pain and return to walking without pain   Currently in Pain? Yes   Pain Score 5    Pain Location Knee   Pain Orientation Right;Left   Pain Descriptors / Indicators Aching                         [facility] Adult PT Treatment/Exercise - 12/13/14 0001    Exercises   Exercises Knee/Hip   Knee/Hip Exercises: Stretches   Gastroc Stretch 2 reps;30 seconds  with towel   Knee/Hip Exercises: Aerobic   Isokinetic --  Nustep L 2 x 4 min no charge   Knee/Hip Exercises: Standing   Forward Step Up Right;2 sets;10 reps   Other Standing Knee Exercises --  heel raises 2x10   Other Standing Knee Exercises balance: toe taps fwd and sideways no to fingertip hold 2x10 each; NBOS with head turns 2x10   Knee/Hip Exercises: Seated   Other Seated Knee Exercises leg press 75lbs double leg x 15, single leg Right 60lbs x 10   Knee/Hip Exercises: Supine   Other Supine Knee Exercises bridges 2x10   Cryotherapy   Number Minutes Cryotherapy 10 Minutes   Cryotherapy Location Knee   Type of Cryotherapy Ice pack   Manual Therapy   Manual Therapy Joint mobilization;Soft tissue mobilization   Manual therapy comments grade 2 AP and PA tib/fib mobs proximally 30s x 3 bouts each, scar mobilizations x 2 min, patellar mobilizations grade 2 in all directions x 2 in     pt needs cues during therex to minimize compensatory strategies. CGA during balance exercises. Pt needs cues to motivate to do balance exercises, as she is fearful.  Pt c/o R calf/plantar foot pain during exercises  in addition to knee pain.            PT Education - 12/13/14 1333    Education provided Yes   Education Details calf stretch with towel.   Person(s) Educated Patient   Methods Explanation   Comprehension Returned demonstration             PT Long Term Goals - 12/09/14 1244    PT LONG TERM GOAL #1   Title pt will ascend/descend 4 steps in step over step pattern using 1 handrail demonstrating improved RLE strength.    Time 4   Period Weeks   Status New   PT LONG TERM GOAL #2   Title pt will squat to pick up item from the ground 3x demonstrating return to ADLs.    Time 4   Period Weeks   Status New   PT LONG TERM GOAL #3   Title pt will increase gait speed to 1.22m/s demonstrating normalized home mobility.    Time 4   Period Weeks    Status New   PT LONG TERM GOAL #4   Title pt will ambulate 520ft with LRAD without rest demonstrating community mobility.    Time 4   Period Weeks   Status New               Plan - 12/13/14 1335    Clinical Impression Statement pt did fair with progression of activity. pt has good motivation, but is limited by pain needin multiple rest breaks due to fatigue and deconditioning. pt also limited by pain in the L knee due to OA. pt ankle pain during exercises likely due to R calf tightness. pt continues to have great knee ROM, but complians of pain and general weakess, impaired mobilty would benefit from continued skilled PT services.    Pt will benefit from skilled therapeutic intervention in order to improve on the following deficits Abnormal gait;Decreased activity tolerance;Decreased balance;Decreased mobility;Decreased strength;Pain;Decreased scar mobility;Decreased knowledge of use of DME;Difficulty walking;Decreased range of motion;Impaired flexibility   Rehab Potential Good   PT Frequency 2x / week   PT Duration 4 weeks   PT Treatment/Interventions DME Instruction;Balance training;Manual techniques;Therapeutic exercise;Electrical Stimulation;Cryotherapy;Therapeutic activities;Dry needling;Passive range of motion;Patient/family education;Scar mobilization;Ultrasound;ADLs/Self Care Home Management;Gait training;Neuromuscular re-education   PT Next Visit Plan progress strengthening.         Problem List There are no active problems to display for this patient.  Carlyon Shadow. Asaph Serena, PT, DPT [phone]  Nana Vastine 12/13/2014, 1:39 PM  [place] [facility] MAIN [facility] SERVICES [address] [place], [place], [address] Phone: [phone]   Fax:  [phone]

## 2014-12-16 ENCOUNTER — Ambulatory Visit: Payer: Medicare Other

## 2014-12-16 DIAGNOSIS — R269 Unspecified abnormalities of gait and mobility: Secondary | ICD-10-CM

## 2014-12-16 DIAGNOSIS — M25561 Pain in right knee: Secondary | ICD-10-CM | POA: Diagnosis not present

## 2014-12-16 NOTE — Therapy (Signed)
[place] MAIN [facility] SERVICES [address] [place], [place], [address] Phone: [phone]   Fax:  [phone]  Physical Therapy Treatment  Patient Details  Name: Hayley Mitchell MRN: [id number] Date of Birth: [birth year] Referring Provider:  Hessie Knows, MD  Encounter Date: 12/16/2014      PT End of Session - 12/16/14 1335    Visit Number 4   Number of Visits 9   Date for PT Re-Evaluation 01/03/15   PT Start Time 1025   PT Stop Time 1110   PT Time Calculation (min) 45 min   Activity Tolerance Patient limited by pain;Patient limited by fatigue   Behavior During Therapy [facility] for tasks assessed/performed      Past Medical History  Diagnosis Date  . Allergy     pt denies any significant medical history to herself or family, no meds.    Past Surgical History  Procedure Laterality Date  . Cholecystectomy    . Joint replacement Left November 02, 2014    There were no vitals filed for this visit.  Visit Diagnosis:  Right knee pain  Abnormality of gait      Subjective Assessment - 12/16/14 1334    Subjective pt reports she has gotten refill on pain meds but hasnt been able to pick them up yet. pt reports her knee is felling a litlte better.    Patient is accompained by: Family member;Interpreter  granddaughter and interpreter   Patient Stated Goals reduce pain and return to walking without pain   Currently in Pain? Yes   Pain Score 5    Pain Location Knee   Pain Orientation Right;Left       Therex: Leg press 45lbs RLE only 3x10 Fwd step up 6inches x 10 RLE only, 1 hand rail, cues to minimize compensatory patterns Sitting LAQ 1lb ankle weight 3x10 Sitting hamstring curl yellow band 3x10 RLE only sidelying hip abd RLE only 2x10 sidelying clammshell RLE only 2x10 Supine bridges 2x10 Calf stretch with towel. 20s x 2  Manual therapy: Scar massage x 2 min Patella mobs grade 3 in all directions x 3 min Grade 2 PA/AP  tib/fib mobs 30s x 2 STM in supine to R calf followed by passive gastroc stretch. Pt reported tenderness to the calf, no signs of DVT. Improved tissue mobility and less pain following.  pts R knee ROM 0-110deg Ice pack to bilateral knees in sitting following therapy x 8 min                          PT Education - 12/16/14 1335    Education Details add sidelying hip abduction to HEP. continue calf stretching with towel   Person(s) Educated Patient   Methods Explanation  via interpreter   Comprehension Verbalized understanding;Returned demonstration             PT Long Term Goals - 12/16/14 1338    PT LONG TERM GOAL #1   Title pt will ascend/descend 4 steps in step over step pattern using 1 handrail demonstrating improved RLE strength.    Time 4   Period Weeks   Status Partially Met   PT LONG TERM GOAL #2   Title pt will squat to pick up item from the ground 3x demonstrating return to ADLs.    Time 4   Period Weeks   Status Partially Met   PT LONG TERM GOAL #3   Title pt will increase  gait speed to 1.56ms demonstrating normalized home mobility.    Time 4   Period Weeks   Status On-going   PT LONG TERM GOAL #4   Title pt will ambulate 5033fwith LRAD without rest demonstrating community mobility.    Time 4   Period Weeks   Status Partially Met               Plan - 052016/06/08336    Clinical Impression Statement pt c/o R calf/foot pain in treatment and c/o this at home. PT performed STM to calf and gastroc stretching, which pt reports really helped at end of session. encouraged pt to continue calf stretching at home and can perform self massage if able. pt progressing well towards goals, but is limited by the L knee pain/OA. modified treatment today to minimize stress on the L knee.    Pt will benefit from skilled therapeutic intervention in order to improve on the following deficits Abnormal gait;Decreased activity tolerance;Decreased  balance;Decreased mobility;Decreased strength;Pain;Decreased scar mobility;Decreased knowledge of use of DME;Difficulty walking;Decreased range of motion;Impaired flexibility   Rehab Potential Good   PT Frequency 2x / week   PT Duration 4 weeks   PT Treatment/Interventions DME Instruction;Balance training;Manual techniques;Therapeutic exercise;Electrical Stimulation;Cryotherapy;Therapeutic activities;Dry needling;Passive range of motion;Patient/family education;Scar mobilization;Ultrasound;ADLs/Self Care Home Management;Gait training;Neuromuscular re-education   PT Next Visit Plan progress strengthening.           G-Codes - 05June 08, 2016338    Functional Limitation Mobility: Walking and moving around   Mobility: Walking and Moving Around Current Status ([id number] least 20 percent but less than 40 percent impaired, limited or restricted   Mobility: Walking and Moving Around Goal Status ([phone] least 1 percent but less than 20 percent impaired, limited or restricted      Problem List There are no active problems to display for this patient. AsGorden HarmsTortorici, PT, DPT #[id number] Amaziah Ghosh 5/Jun 08, 20161:39 PM  [facility] [address] [place]: [phone] Fax:  [phone]

## 2014-12-21 ENCOUNTER — Ambulatory Visit: Payer: Medicare Other

## 2014-12-21 DIAGNOSIS — R269 Unspecified abnormalities of gait and mobility: Secondary | ICD-10-CM

## 2014-12-21 DIAGNOSIS — M25561 Pain in right knee: Secondary | ICD-10-CM

## 2014-12-21 NOTE — Therapy (Signed)
[place] MAIN [facility] SERVICES [address] [place], [place], [address] Phone: [phone]   Fax:  [phone]  Physical Therapy Treatment  Patient Details  Name: Hayley [place] MRN: [id number] Date of Birth: 1946/02/10 Referring Provider:  Hessie Knows, MD  Encounter Date: 12/21/2014      PT End of Session - 12/21/14 1314    Visit Number 5   Number of Visits 9   Date for PT Re-Evaluation 01/03/15   PT Start Time 1033   PT Stop Time 1123   PT Time Calculation (min) 50 min   Activity Tolerance Patient limited by fatigue;Patient tolerated treatment well   Behavior During Therapy [facility] for tasks assessed/performed      Past Medical History  Diagnosis Date  . Allergy     pt denies any significant medical history to herself or family, no meds.    Past Surgical History  Procedure Laterality Date  . Cholecystectomy    . Joint replacement Left November 02, 2014    There were no vitals filed for this visit.  Visit Diagnosis:  Right knee pain  Abnormality of gait      Subjective Assessment - 12/21/14 1312    Subjective pt reports she is happy with her progress on the R knee. pt reports she had follow up with MD who she reports wants her to have a litlte more therapy. pt reports her R calf and foot felt better for a while after last session.    Patient is accompained by: Family member;Interpreter  granddaughter and interpreter   Patient Stated Goals reduce pain and return to walking without pain   Currently in Pain? Yes   Pain Score 3    Pain Location Knee  bilateal calfs/feet.    Pain Orientation Right;Left   Pain Descriptors / Indicators Aching;Tightness      Nustep L0- LE only x 3 min - no charge Leg press RLE only 60lbs 3x10 Fwd step ups: RLE only - 1 handrail 2x10 Hamstring curl : yellow band 3x10 LAQ RLE only 3 lbs 3x10 Standing resisted hip flexion, abduction ,adduction and extension with  x 10 each way Side steps  with yellow band in // bars x 3 laps sidelying hip abd 2x10 sidelying hip ER 2x10 Bridges with ball squeeze 2x10 Pt cued to minimize compensatory patterns during exercises. Pt required min- mod cues for exercise performance. Pt also requried brief seated rest due to fatigue.   Brief STM to bilateral gastrocs followed by calf stretching passively x 5 min no charge  Treatment followed by ice to bilateral knees in supine x 10 min                             PT Education - 12/21/14 1314    Education provided Yes   Education Details calf stretch to be done bilaterally    Person(s) Educated Patient   Methods Explanation   Comprehension Verbalized understanding             PT Long Term Goals - 12/16/14 1338    PT LONG TERM GOAL #1   Title pt will ascend/descend 4 steps in step over step pattern using 1 handrail demonstrating improved RLE strength.    Time 4   Period Weeks   Status Partially Met   PT LONG TERM GOAL #2   Title pt will squat to pick up item from the ground 3x demonstrating return  to ADLs.    Time 4   Period Weeks   Status Partially Met   PT LONG TERM GOAL #3   Title pt will increase gait speed to 1.51ms demonstrating normalized home mobility.    Time 4   Period Weeks   Status On-going   PT LONG TERM GOAL #4   Title pt will ambulate 5032fwith LRAD without rest demonstrating community mobility.    Time 4   Period Weeks   Status Partially Met               Plan - 12/21/14 1316    Clinical Impression Statement pt did well with progression of strengthening, although limited by general fatigue/deconditioning. brief STM followed by stretching did relieve her calf tightness and foot pain. did include LLE instrengthening program today for general conditioning  as requested per MD .pt did not have increased L leg pain with treatment.    Pt will benefit from skilled therapeutic intervention in order to improve on the following deficits  Abnormal gait;Decreased activity tolerance;Decreased balance;Decreased mobility;Decreased strength;Pain;Decreased scar mobility;Decreased knowledge of use of DME;Difficulty walking;Decreased range of motion;Impaired flexibility   Rehab Potential Good   PT Frequency 2x / week   PT Duration 4 weeks   PT Treatment/Interventions DME Instruction;Balance training;Manual techniques;Therapeutic exercise;Electrical Stimulation;Cryotherapy;Therapeutic activities;Dry needling;Passive range of motion;Patient/family education;Scar mobilization;Ultrasound;ADLs/Self Care Home Management;Gait training;Neuromuscular re-education        Problem List There are no active problems to display for this patient.  AsGorden HarmsTortorici, PT, DPT #[phone],Hong Timm 12/21/2014, 1:18 PM  CoBlue EarthAIN REUpson [facility] CenterERVICES [address] [address] [place]hone: [phone] Fax:  [phone]

## 2014-12-23 ENCOUNTER — Ambulatory Visit: Payer: Medicare Other | Attending: Orthopedic Surgery

## 2014-12-23 DIAGNOSIS — R269 Unspecified abnormalities of gait and mobility: Secondary | ICD-10-CM | POA: Insufficient documentation

## 2014-12-23 DIAGNOSIS — M25561 Pain in right knee: Secondary | ICD-10-CM | POA: Diagnosis not present

## 2014-12-23 NOTE — Therapy (Signed)
[place] MAIN [facility] SERVICES [address] [place], [place], [address] Phone: [phone]   Fax:  [phone]  Physical Therapy Treatment  Patient Details  Name: Hayley [place] MRN: [id number] Date of Birth: 09/05/1945 Referring Provider:  Hessie Knows, MD  Encounter Date: 12/23/2014      PT End of Session - 12/23/14 1648    Visit Number 6   Number of Visits 9   Date for PT Re-Evaluation 01/03/15   PT Start Time 1033   PT Stop Time 1120   PT Time Calculation (min) 47 min   Activity Tolerance Patient limited by fatigue;Patient tolerated treatment well   Behavior During Therapy [facility] for tasks assessed/performed      Past Medical History  Diagnosis Date  . Allergy     pt denies any significant medical history to herself or family, no meds.    Past Surgical History  Procedure Laterality Date  . Cholecystectomy    . Joint replacement Left November 02, 2014    There were no vitals filed for this visit.  Visit Diagnosis:  Right knee pain  Abnormality of gait      Subjective Assessment - 12/23/14 1647    Subjective pt reports her L knee is very sore, but she is scared to have it replaced since she had the R one done twice. pt reports she is sore, but is happy with the progress on the R knee.    Patient Stated Goals reduce pain and return to walking without pain   Pain Score 5    Pain Location Knee   Pain Orientation Right;Left       Therex: Nustep L 3 x 3 min no charge warm up Side steps - yellow band x 3 laps in // bars Standing hip flexion, abduction, extension 2x 10 each Sitting LAQ 3lbs 2x10 TKE with ball on wall 2x10 Sit to stand from elevated mat table- no UE 2x10 (LLE elevated) Pt needs min-mod cues for proper exercise performance, SBA for safety                                PT Long Term Goals - 12/16/14 1338    PT LONG TERM GOAL #1   Title pt will ascend/descend 4 steps in step over  step pattern using 1 handrail demonstrating improved RLE strength.    Time 4   Period Weeks   Status Partially Met   PT LONG TERM GOAL #2   Title pt will squat to pick up item from the ground 3x demonstrating return to ADLs.    Time 4   Period Weeks   Status Partially Met   PT LONG TERM GOAL #3   Title pt will increase gait speed to 1.66ms demonstrating normalized home mobility.    Time 4   Period Weeks   Status On-going   PT LONG TERM GOAL #4   Title pt will ambulate 507fwith LRAD without rest demonstrating community mobility.    Time 4   Period Weeks   Status Partially Met               Plan - 12/23/14 1648    Clinical Impression Statement pt showing improved motivation and stamina for exercises, although still fatigues quickly. progressed strengthening today without issue. pt reported no increased pain with treatment.    Pt will benefit from skilled therapeutic intervention in order to improve  on the following deficits Abnormal gait;Decreased activity tolerance;Decreased balance;Decreased mobility;Decreased strength;Pain;Decreased scar mobility;Decreased knowledge of use of DME;Difficulty walking;Decreased range of motion;Impaired flexibility   Rehab Potential Good   PT Frequency 2x / week   PT Duration 4 weeks   PT Treatment/Interventions DME Instruction;Balance training;Manual techniques;Therapeutic exercise;Electrical Stimulation;Cryotherapy;Therapeutic activities;Dry needling;Passive range of motion;Patient/family education;Scar mobilization;Ultrasound;ADLs/Self Care Home Management;Gait training;Neuromuscular re-education        Problem List There are no active problems to display for this patient.  Gorden Harms. Aylinn Rydberg, PT, DPT [phone]   Fredonia Casalino 12/23/2014, 4:51 PM  [place] MAIN [facility] SERVICES [address] [place], [place], [address] Phone: [phone]   Fax:  [phone]

## 2014-12-27 ENCOUNTER — Ambulatory Visit: Payer: Medicare Other | Attending: Orthopedic Surgery

## 2014-12-27 DIAGNOSIS — M25561 Pain in right knee: Secondary | ICD-10-CM | POA: Insufficient documentation

## 2014-12-27 DIAGNOSIS — R269 Unspecified abnormalities of gait and mobility: Secondary | ICD-10-CM | POA: Diagnosis not present

## 2014-12-27 DIAGNOSIS — Z96651 Presence of right artificial knee joint: Secondary | ICD-10-CM | POA: Insufficient documentation

## 2014-12-27 NOTE — Therapy (Signed)
[place] MAIN [facility] SERVICES [address] [place], [place], [address] Phone: [phone]   Fax:  [phone]  Physical Therapy Treatment  Patient Details  Name: KYUNG MUTO MRN: [id number] Date of Birth: [birth year] Referring Provider:  Hessie Knows, MD  Encounter Date: 12/27/2014      PT End of Session - 12/27/14 1258    Visit Number 7   Number of Visits 9   Date for PT Re-Evaluation 01/03/15   PT Start Time 5681   PT Stop Time 1120   PT Time Calculation (min) 45 min   Equipment Utilized During Treatment Gait belt   Activity Tolerance Patient limited by fatigue;Patient tolerated treatment well   Behavior During Therapy [facility] for tasks assessed/performed      Past Medical History  Diagnosis Date  . Allergy     pt denies any significant medical history to herself or family, no meds.    Past Surgical History  Procedure Laterality Date  . Cholecystectomy    . Joint replacement Left November 02, 2014    There were no vitals filed for this visit.  Visit Diagnosis:  Right knee pain  Abnormality of gait      Subjective Assessment - 12/27/14 1256    Subjective pt reports she feels like she is getting stronger. she is happy with her progress, she reports her R leg feels longer and she feels it is making her walk different, which seems to make her back hurt.    Patient Stated Goals reduce pain and return to walking without pain   Currently in Pain? Yes   Pain Score 5    Pain Location Knee  also 3/10 low back pain.    Pain Orientation Right;Left       Therex: Nustep: L 3 x 5 min no charge warm up Fwd step up: 6inches 1 hand rail x 15 Seated hamstring curl green band 2x10 LAQ 5lbs 2x10 Red band: hip flexion/ abduction/extension x 10 each way single HHA Sit to stand from elevated mat no UE 2x10 AIREX : arms over head x 10 AIREX eyes open/closed 10s x 5 Pt required CGA for safety with balance exercises, min cues for  proper exercise performance.                           PT Education - 12/27/14 1257    Education provided Yes   Education Details prognosis regarding gradual reduction of pain over time.    Person(s) Educated Patient   Methods Explanation  via translator   Comprehension Verbalized understanding             PT Long Term Goals - 12/16/14 1338    PT LONG TERM GOAL #1   Title pt will ascend/descend 4 steps in step over step pattern using 1 handrail demonstrating improved RLE strength.    Time 4   Period Weeks   Status Partially Met   PT LONG TERM GOAL #2   Title pt will squat to pick up item from the ground 3x demonstrating return to ADLs.    Time 4   Period Weeks   Status Partially Met   PT LONG TERM GOAL #3   Title pt will increase gait speed to 1.47ms demonstrating normalized home mobility.    Time 4   Period Weeks   Status On-going   PT LONG TERM GOAL #4   Title pt will ambulate 5022fwith LRAD  without rest demonstrating community mobility.    Time 4   Period Weeks   Status Partially Met               Plan - 12/27/14 1258    Clinical Impression Statement pt did well with progression of resistance exercises. We did not do table top exercises due to pt wearing attire that would expose her. progressed strengthening and balance, which were tolerated well. pt reports she felt better after session.    Pt will benefit from skilled therapeutic intervention in order to improve on the following deficits Abnormal gait;Decreased activity tolerance;Decreased balance;Decreased mobility;Decreased strength;Pain;Decreased scar mobility;Decreased knowledge of use of DME;Difficulty walking;Decreased range of motion;Impaired flexibility   Rehab Potential Good   PT Frequency 2x / week   PT Duration 4 weeks   PT Treatment/Interventions DME Instruction;Balance training;Manual techniques;Therapeutic exercise;Electrical Stimulation;Cryotherapy;Therapeutic  activities;Dry needling;Passive range of motion;Patient/family education;Scar mobilization;Ultrasound;ADLs/Self Care Home Management;Gait training;Neuromuscular re-education   PT Next Visit Plan progress note next visit        Problem List There are no active problems to display for this patient.  Gorden Harms. Judiann Celia, PT, DPT [phone]  Era Parr 12/27/2014, 1:01 PM  [place] MAIN [facility] SERVICES [address] [place], [place], [address] Phone: [phone]   Fax:  [phone]

## 2014-12-30 ENCOUNTER — Ambulatory Visit: Payer: Medicare Other

## 2014-12-30 DIAGNOSIS — R269 Unspecified abnormalities of gait and mobility: Secondary | ICD-10-CM

## 2014-12-30 DIAGNOSIS — M25561 Pain in right knee: Secondary | ICD-10-CM

## 2014-12-30 NOTE — Therapy (Signed)
[place] MAIN [facility] SERVICES [address] [place], [place], [address] Phone: [phone]   Fax:  [phone]  Physical Therapy Treatment  Patient Details  Name: Hayley [place] MRN: [id number] Date of Birth: 04-Oct-1945 Referring Provider:  Hessie Knows, MD  Encounter Date: 12/30/2014      PT End of Session - 12/30/14 1222    Number of Visits --   Date for PT Re-Evaluation --      Past Medical History  Diagnosis Date  . Allergy     pt denies any significant medical history to herself or family, no meds.    Past Surgical History  Procedure Laterality Date  . Cholecystectomy    . Joint replacement Left November 02, 2014    There were no vitals filed for this visit.  Visit Diagnosis:  Right knee pain  Abnormality of gait      Subjective Assessment - 12/30/14 1108    Subjective pt relates she doing well this morning and is looking forward to therapy to make her R leg better.  Pt reports her pain is an 8/10 and her L LE is worse. Pt relates her R calf feels tight and slighlty painful.     Patient is accompained by: Family member;Interpreter   Patient Stated Goals to decrease pain and increase her standing tolerance   Currently in Pain? Yes   Pain Score 8    Pain Location Knee   Pain Orientation Right   Pain Descriptors / Indicators Aching;Tightness      Treatment: Therex: Nustep x 5 min (no charge, warm-up) Forward step-ups on stairs (6inch) 2x10 with R LE Right LE sidestep on 6 inch step 2x10, pt required verbal and tactile cues to maintain hips leveled during descending phase B leg press with 60# 2x10; R LE leg press with 60# 2x10 B clamshells 2x10 and bridges 2x10; pt required verbal and tactile cues for correct technique Weight shifts on blue foam for 2 min; forward/backwards and side to side Squats on blue foam 2x10, pt required verbal cues for correct technique   Manual therapy: soft tissue mobilization on R calf  to decrease tightness and pain  X 10 min, with contract relax technique to stretch calf  Pt resp                         PT Education - 12/30/14 1116    Education provided Yes   Education Details increasing hip strength (glute medius, glute max) to improve gait and pain    Person(s) Educated Patient   Methods Explanation;Demonstration   Comprehension Verbalized understanding             PT Long Term Goals - 12/16/14 1338    PT LONG TERM GOAL #1   Title pt will ascend/descend 4 steps in step over step pattern using 1 handrail demonstrating improved RLE strength.    Time 4   Period Weeks   Status Partially Met   PT LONG TERM GOAL #2   Title pt will squat to pick up item from the ground 3x demonstrating return to ADLs.    Time 4   Period Weeks   Status Partially Met   PT LONG TERM GOAL #3   Title pt will increase gait speed to 1.37ms demonstrating normalized home mobility.    Time 4   Period Weeks   Status On-going   PT LONG TERM GOAL #4   Title pt will  ambulate 527f with LRAD without rest demonstrating community mobility.    Time 4   Period Weeks   Status Partially Met               Plan - 12/30/14 1121    Clinical Impression Statement pt was able to complete session today even with the increase in B knee pain.  Pt had difficulty maintaining hips leveled with side step-ups due to pain and weak glute med, thus pt was introduced to clam shells and bridges to increase hip strength to improve functional mobility.  Pt continues to present with tight  right calf which is contributing to her altered gait and knee pain.  Pt reports her right calf felt better after session   Pt will benefit from skilled therapeutic intervention in order to improve on the following deficits Abnormal gait;Decreased range of motion;Difficulty walking;Decreased endurance;Decreased activity tolerance;Pain;Impaired flexibility;Decreased strength;Decreased mobility   Rehab  Potential Good   PT Frequency 2x / week   PT Duration 4 weeks   PT Treatment/Interventions DME Instruction;Balance training;Manual techniques;Therapeutic exercise;Electrical Stimulation;Cryotherapy;Therapeutic activities;Dry needling;Passive range of motion;Patient/family education;Scar mobilization;Ultrasound;ADLs/Self Care Home Management;Gait training;Neuromuscular re-education   PT Next Visit Plan progress note next visit including 6 min walk        Problem List There are no active problems to display for this patient.  AGorden Harms Coley Littles, PT, DPT #[phone] Nyima Vanacker 12/30/2014, 12:32 PM  [place] RSurgical [facility] LLCSERVICES [address]RShoreacres N[place] [address]Phone: [phone]  Fax:  [phone]

## 2014-12-30 NOTE — Patient Instructions (Signed)
[email or web address] Clamshells 2x10 Bridges 2x10

## 2015-01-04 ENCOUNTER — Ambulatory Visit: Payer: Medicare Other

## 2015-01-04 DIAGNOSIS — M25561 Pain in right knee: Secondary | ICD-10-CM

## 2015-01-04 DIAGNOSIS — R269 Unspecified abnormalities of gait and mobility: Secondary | ICD-10-CM | POA: Diagnosis not present

## 2015-01-04 NOTE — Therapy (Signed)
[place] MAIN [facility] SERVICES [address] [place], [place], [address] Phone: [phone]   Fax:  [phone]  Physical Therapy Treatment  Patient Details  Name: Hayley [place] MRN: [id number] Date of Birth: [birth year] Referring Provider:  Hessie Knows, MD  Encounter Date: 01/04/2015      PT End of Session - 01/04/15 1521    Visit Number 9   Number of Visits 9   Date for PT Re-Evaluation 01/03/15   PT Start Time 1115   PT Stop Time 1200   PT Time Calculation (min) 45 min   Activity Tolerance Patient tolerated treatment well;Patient limited by pain   Behavior During Therapy [facility] for tasks assessed/performed      Past Medical History  Diagnosis Date  . Allergy     pt denies any significant medical history to herself or family, no meds.    Past Surgical History  Procedure Laterality Date  . Cholecystectomy    . Joint replacement Left November 02, 2014    There were no vitals filed for this visit.  Visit Diagnosis:  Abnormality of gait - Plan: PT plan of care cert/re-cert  Right knee pain - Plan: PT plan of care cert/re-cert      Subjective Assessment - 01/04/15 1506    Subjective Pt relates she doing a little better today after using her patellar tendon strap she purchased 3 days ago per MD's recommendation.  Pt reports she has a follow up with her surgeon tomorrow.  Pt relates she currently has a pain rating of 8/10 states her calf continues to feel tight and after prolonged walking she begins to experience numbness/cramping sensation below mid lower leg.     Patient is accompained by: Family member;Interpreter   Limitations Walking   Patient Stated Goals to decrease pain and increase her standing tolerance   Currently in Pain? Yes   Pain Score 8    Pain Location Knee   Pain Descriptors / Indicators Aching;Tender;Tightness;Cramping   Aggravating Factors  walking   Pain Relieving Factors rest     warm-up: nustep x 5  minutes    Outcome measures: 6 min walk test:  400 feet 10 meter gait speed: 0.43 m/s Based on the outcome measures, pt is under the limited community category.  Stair negotiation 4 steps x 2 with railing.  Pt required visual and verbal cues to perform this safely  Manual therapy Soft tissue moblization of right calf x5 min to decrease tightness/numbness/crambing Pt reported pain decreased to a 6/10 after soft tissue mobilization   MMT: Bilateral hip flexion: 3+/10 Bilateral knee extension 4+/10 Bilateral knee flexion 5/5 Modified seated hip abduction/adduction: 5/5                   [facility] Adult PT Treatment/Exercise - 01/04/15 1518    Knee/Hip Exercises: Stretches   Gastroc Stretch --                PT Education - 01/04/15 1520    Education Details to continue stretching her calf to improve mobility and decrease pain    Person(s) Educated Patient   Methods Explanation;Demonstration   Comprehension Verbalized understanding;Returned demonstration             PT Long Term Goals - 12/16/14 1338    PT LONG TERM GOAL #1   Title pt will ascend/descend 4 steps in step over step pattern using 1 handrail demonstrating improved RLE strength.    Time 4  Period Weeks   Status Partially Met   PT LONG TERM GOAL #2   Title pt will squat to pick up item from the ground 3x demonstrating return to ADLs.    Time 4   Period Weeks   Status Partially Met   PT LONG TERM GOAL #3   Title pt will increase gait speed to 1.29ms demonstrating normalized home mobility.    Time 4   Period Weeks   Status On-going   PT LONG TERM GOAL #4   Title pt will ambulate 5034fwith LRAD without rest demonstrating community mobility.    Time 4   Period Weeks   Status Partially Met               Plan - 06June 30, [id number]    Clinical Impression Statement Reassessed pt today.  pt has partially met her goals at this time. Pt improved her gait speed to 0.43 m/s, ambulated 400  ft in 6 minutes and was able to perform stair negotiation with a step over step pattern with two hand rails thus indicating she is making improvements with skilled physical therapy.  Pt would benefit from continued skilled physical therapy to improve remaining deficits such as strength, gait, functional activity tolerance to improve to improve quality of life.     Pt will benefit from skilled therapeutic intervention in order to improve on the following deficits Abnormal gait;Decreased range of motion;Difficulty walking;Decreased endurance;Decreased activity tolerance;Pain;Impaired flexibility;Decreased strength;Decreased mobility   Rehab Potential Good   PT Frequency 2x / week   PT Duration 3 weeks   PT Treatment/Interventions DME Instruction;Balance training;Manual techniques;Therapeutic exercise;Electrical Stimulation;Cryotherapy;Therapeutic activities;Dry needling;Passive range of motion;Patient/family education;Scar mobilization;Ultrasound;ADLs/Self Care Home Management;Gait training;Neuromuscular re-education   PT Next Visit Plan reveiw HEP          G-Codes - [id number]    Functional Limitation Mobility: Walking and moving around   Mobility: Walking and Moving Around Current Status ([id number] least 20 percent but less than 40 percent impaired, limited or restricted   Mobility: Walking and Moving Around Goal Status ([id number] least 1 percent but less than 20 percent impaired, limited or restricted      Problem List There are no active problems to display for this patient.  AsGorden HarmsTortorici, PT, DPT #[id number],Hayley Putman 12/2014-06-306:08 PM  CoBuckhornAIN RESurgery [facility] Laser [facility] At Professional Park LLCERVICES [address]hone: [phone] Fax:  [phone]

## 2015-01-06 ENCOUNTER — Ambulatory Visit: Payer: Medicare Other

## 2015-01-06 DIAGNOSIS — R269 Unspecified abnormalities of gait and mobility: Secondary | ICD-10-CM | POA: Diagnosis not present

## 2015-01-06 DIAGNOSIS — M25561 Pain in right knee: Secondary | ICD-10-CM

## 2015-01-06 NOTE — Therapy (Signed)
[place] MAIN [facility] SERVICES [address] [place], [place], [address] Phone: [phone]   Fax:  [phone]  Physical Therapy Treatment  Patient Details  Name: Hayley Mitchell MRN: [id number] Date of Birth: [birth year] Referring Provider:  Hessie Knows, MD  Encounter Date: 01/06/2015      PT End of Session - 01/06/15 1219    Visit Number 10   Number of Visits 15   Date for PT Re-Evaluation 01/25/15   PT Start Time 1110   PT Stop Time 1200   PT Time Calculation (min) 50 min   Activity Tolerance Patient tolerated treatment well;Patient limited by pain   Behavior During Therapy [facility] for tasks assessed/performed      Past Medical History  Diagnosis Date  . Allergy     pt denies any significant medical history to herself or family, no meds.    Past Surgical History  Procedure Laterality Date  . Cholecystectomy    . Joint replacement Left November 02, 2014    There were no vitals filed for this visit.  Visit Diagnosis:  Abnormality of gait  Right knee pain      Subjective Assessment - 01/06/15 1209    Subjective Pt relates she still has 8/10 pain in her right knee and continues to to experience numbness bellow mid lower leg with prolonged standing and walking. Pt reports she saw her surgeon yesterday who relates she is progressing well and is walking "perfectly."     Patient is accompained by: Family member;Interpreter   Limitations Walking   Patient Stated Goals to decrease pain and increase her standing and ambulation tolerance   Currently in Pain? Yes   Pain Score 8    Pain Location Knee   Pain Orientation Right   Pain Descriptors / Indicators Aching;Sharp   Aggravating Factors  walking and prolonged standing    Pain Relieving Factors rest      Warm up:  Nustep x 5 min  Therex: Seated calf stretch 2x30 sec Supine hamstring stretch with strap 2x30 sec Clamshells 2x10 Bridges 2x10 Standing calf stretch  2x30 Squats on blue foam x 10 Pt required verbal and tactile cueing to perform the above exercises with correct technique. Pt required verbal cueing to not stretch to the point of pain.    Manual therapy: Soft tissue mobilization to right lateral hip x 10 min to decrease tightness and pain Pt reported decreased pain and relaxing sensation.                             PT Education - 01/06/15 1217    Education provided Yes   Education Details to leave stretches until the end of her HEP and to stretch to the point where she feels a comfortable stretch not to the point of pain.     Person(s) Educated Patient   Methods Explanation;Demonstration   Comprehension Verbalized understanding;Returned demonstration             PT Long Term Goals - 12/16/14 1338    PT LONG TERM GOAL #1   Title pt will ascend/descend 4 steps in step over step pattern using 1 handrail demonstrating improved RLE strength.    Time 4   Period Weeks   Status Partially Met   PT LONG TERM GOAL #2   Title pt will squat to pick up item from the ground 3x demonstrating return to ADLs.    Time 4  Period Weeks   Status Partially Met   PT LONG TERM GOAL #3   Title pt will increase gait speed to 1.97ms demonstrating normalized home mobility.    Time 4   Period Weeks   Status On-going   PT LONG TERM GOAL #4   Title pt will ambulate 5041fwith LRAD without rest demonstrating community mobility.    Time 4   Period Weeks   Status Partially Met               Plan - 01/06/15 1221    Clinical Impression Statement Reveiwed HEP to ensure proper form and duration.  Pt required verbal and tactile cueing to correct her form to decrease pain during exercises and to target the intended musculature.  Pt experienced decreased pain after soft tissue moblization to her lateral hip.  Pt would benefit from continued skilled physical therapy to improve deficits and decrease pain.     Pt will benefit  from skilled therapeutic intervention in order to improve on the following deficits Abnormal gait;Decreased range of motion;Difficulty walking;Decreased endurance;Decreased activity tolerance;Pain;Impaired flexibility;Decreased strength;Decreased mobility   Rehab Potential Good   PT Frequency 2x / week   PT Duration 3 weeks   PT Treatment/Interventions DME Instruction;Balance training;Manual techniques;Therapeutic exercise;Electrical Stimulation;Cryotherapy;Therapeutic activities;Dry needling;Passive range of motion;Patient/family education;Scar mobilization;Ultrasound;ADLs/Self Care Home Management;Gait training;Neuromuscular re-education   PT Next Visit Plan soft tissue moblizaiton, self soft tissue moblization, progress strength exercises         Problem List There are no active problems to display for this patient.   Atisha Hamidi 01/06/2015, 12:28 PM  Co[place]AIN RECura[facility]ERVICES [address]one: [phone] Fax:  [phone]   AsGorden HarmsTortorici, PT, DPT #[id number]

## 2015-01-11 ENCOUNTER — Ambulatory Visit: Payer: Medicare Other

## 2015-01-11 DIAGNOSIS — R269 Unspecified abnormalities of gait and mobility: Secondary | ICD-10-CM | POA: Diagnosis not present

## 2015-01-11 DIAGNOSIS — M25561 Pain in right knee: Secondary | ICD-10-CM

## 2015-01-11 NOTE — Therapy (Signed)
[place] MAIN [facility] SERVICES [address] [place], [place], [address] Phone: [phone]   Fax:  [phone]  Physical Therapy Treatment  Patient Details  Name: Hayley Mitchell MRN: [id number] Date of Birth: 03-30-1946 Referring Provider:  Hessie Knows, MD  Encounter Date: 01/11/2015      PT End of Session - 01/11/15 1316    Visit Number 11   Number of Visits 15   Date for PT Re-Evaluation 01/25/15   PT Start Time 1125   PT Stop Time 1205   PT Time Calculation (min) 40 min   Activity Tolerance Patient tolerated treatment well;Patient limited by pain   Behavior During Therapy [facility] for tasks assessed/performed      Past Medical History  Diagnosis Date  . Allergy     pt denies any significant medical history to herself or family, no meds.    Past Surgical History  Procedure Laterality Date  . Cholecystectomy    . Joint replacement Left November 02, 2014    There were no vitals filed for this visit.  Visit Diagnosis:  Right knee pain  Abnormality of gait      Subjective Assessment - 01/11/15 1312    Subjective Pt relates she notices her R LE getting stronger but continues to experience 8/10 pain in her right knee.  Pt reports she continues to experience LBP and let knee pain which increases with physical activity.     Patient is accompained by: Family member;Interpreter   Limitations Walking   Patient Stated Goals to decrease pain and increase her standing and ambulation tolerance   Pain Score 8    Pain Location Knee   Pain Orientation Right   Pain Descriptors / Indicators Aching        Warm-up :Nustep x 5 min    There ex: Hip flexion/extension/abduction in standing with yellow therraband, 2x10 each both LEs Step up and over airex to 4 inch step to airex x 10 Leg press with 120# 2x10 Right gastroc stretch 2x30 sec Right soleus stretch 2x30 Pt required verbal and tactile cues to perform the above exercises with  correct technique.                         PT Education - 01/11/15 1315    Education provided Yes   Education Details the benefits of increasing bilateral LE strength to improve bilateral knee pain and improve post outcome if she decides to have TKA on her L LE.  to perform soelus stretch after gastroc stretch.     Person(s) Educated Patient   Methods Explanation;Demonstration   Comprehension Verbalized understanding;Returned demonstration             PT Long Term Goals - 12/16/14 1338    PT LONG TERM GOAL #1   Title pt will ascend/descend 4 steps in step over step pattern using 1 handrail demonstrating improved RLE strength.    Time 4   Period Weeks   Status Partially Met   PT LONG TERM GOAL #2   Title pt will squat to pick up item from the ground 3x demonstrating return to ADLs.    Time 4   Period Weeks   Status Partially Met   PT LONG TERM GOAL #3   Title pt will increase gait speed to 1.64ms demonstrating normalized home mobility.    Time 4   Period Weeks   Status On-going   PT LONG TERM GOAL #4  Title pt will ambulate 586f with LRAD without rest demonstrating community mobility.    Time 4   Period Weeks   Status Partially Met               Plan - 01/11/15 1318    Clinical Impression Statement Pt was able to complete progression of l exercises with minimal complaint of R knee pain.  Pt demonstates increased functional activity tolerance by being able to complete exercises with only one extend break of 2-3 minutes.  pt would benefit from continued PT services to improve remaining deficits.     Pt will benefit from skilled therapeutic intervention in order to improve on the following deficits Abnormal gait;Decreased range of motion;Difficulty walking;Decreased endurance;Decreased activity tolerance;Pain;Impaired flexibility;Decreased strength   Rehab Potential Good   PT Frequency 2x / week   PT Duration 3 weeks   PT Treatment/Interventions  DME Instruction;Balance training;Manual techniques;Therapeutic exercise;Electrical Stimulation;Cryotherapy;Therapeutic activities;Dry needling;Passive range of motion;Patient/family education;Scar mobilization;Ultrasound;ADLs/Self Care Home Management;Gait training;Neuromuscular re-education   PT Next Visit Plan soft tissue moblizaiton, self soft tissue moblization, progress strength exercises       JRenford Dills SPT  Problem List There are no active problems to display for this patient.  This entire session was performed under direct supervision and direction of a licensed therapist/therapist assistant . I have personally read, edited and approve of the note as written. AGorden Harms Tortorici, PT, DPT #[id number] Tortorici,Ashley 01/11/2015, 2:03 PM  CEastonMAIN RAur[facility] CtySERVICES [address] [address]Phone: [phone]  Fax:  [phone]

## 2015-01-13 ENCOUNTER — Encounter: Payer: Self-pay | Admitting: Physical Therapy

## 2015-01-13 ENCOUNTER — Ambulatory Visit: Payer: Medicare Other | Admitting: Physical Therapy

## 2015-01-13 DIAGNOSIS — R269 Unspecified abnormalities of gait and mobility: Secondary | ICD-10-CM | POA: Diagnosis not present

## 2015-01-13 DIAGNOSIS — M25561 Pain in right knee: Secondary | ICD-10-CM

## 2015-01-13 NOTE — Therapy (Signed)
[place] MAIN [facility] SERVICES [address] [place], [place], [address] Phone: [phone]   Fax:  [phone]  Physical Therapy Treatment  Patient Details  Name: JENNAVIE MARTINEK MRN: [id number] Date of Birth: [birth year] Referring Provider:  Hessie Knows, MD  Encounter Date: 01/13/2015      PT End of Session - 01/13/15 1701    Visit Number 12   Number of Visits 15   Date for PT Re-Evaluation 01/25/15   PT Start Time 1125   PT Stop Time 1208   PT Time Calculation (min) 43 min   Equipment Utilized During Treatment Gait belt   Activity Tolerance Patient tolerated treatment well;Patient limited by pain   Behavior During Therapy [facility] for tasks assessed/performed      Past Medical History  Diagnosis Date  . Allergy     pt denies any significant medical history to herself or family, no meds.    Past Surgical History  Procedure Laterality Date  . Cholecystectomy    . Joint replacement Left November 02, 2014    There were no vitals filed for this visit.  Visit Diagnosis:  Right knee pain  Abnormality of gait      Subjective Assessment - 01/13/15 1655    Subjective Patient relates she continues to have bilateral knee pain and lower back pain. She has been having difficulty walking secondary to pain, she states she thinks she is getting stronger but still has the pain. More pain on the left knee than the right today.    Patient is accompained by: Family member;Interpreter   Limitations Walking   Patient Stated Goals to decrease pain and increase her standing and ambulation tolerance   Currently in Pain? --  Patient states she has pain in both knees, but does not provide a specific number for this.        There-Ex   Leg PRess x 75# for 10 repetitions, 120# x 10 repetitions, 135# x 10 repetitions for 2 sets  Bridging x 10 with cuing for toes up, she had minimal pain in lower back, provided red theraband on second set, increased  gluteal activation x 10 repetitions  Side stepping with red-theraband x 5' bilaterally, good activation however she began feeling pain in her hips.  Standing hip abductions x 8 bilaterally with good activation of hip musculature but she felt pain, particularly in her right hip.  Standing ankle mobilizations x 10 with 3" holds bilaterally for 2 sets                          PT Education - 01/13/15 1658    Education Details Educated patient on exercise technique, provided quadricep stretch as part of her HEP.    Person(s) Educated Patient   Methods Explanation;Demonstration   Comprehension Verbalized understanding;Returned demonstration             PT Long Term Goals - 12/16/14 1338    PT LONG TERM GOAL #1   Title pt will ascend/descend 4 steps in step over step pattern using 1 handrail demonstrating improved RLE strength.    Time 4   Period Weeks   Status Partially Met   PT LONG TERM GOAL #2   Title pt will squat to pick up item from the ground 3x demonstrating return to ADLs.    Time 4   Period Weeks   Status Partially Met   PT LONG TERM GOAL #3   Title  pt will increase gait speed to 1.54ms demonstrating normalized home mobility.    Time 4   Period Weeks   Status On-going   PT LONG TERM GOAL #4   Title pt will ambulate 5088fwith LRAD without rest demonstrating community mobility.    Time 4   Period Weeks   Status Partially Met               Plan - 01/13/15 1702    Clinical Impression Statement Patient was able to progress with strengthening program and found relief with quadricep stretching. Patient continues to be limited by fatigue and pain and can only complete a certain number of extercises prior to needed a prolonged rest break. Skilled PT services are indicated to increase her mobility and reduce her pain/    Pt will benefit from skilled therapeutic intervention in order to improve on the following deficits Abnormal gait;Decreased range  of motion;Difficulty walking;Decreased endurance;Decreased activity tolerance;Pain;Impaired flexibility;Decreased strength   Rehab Potential Good   PT Frequency 2x / week   PT Duration 3 weeks   PT Treatment/Interventions DME Instruction;Balance training;Manual techniques;Therapeutic exercise;Electrical Stimulation;Cryotherapy;Therapeutic activities;Dry needling;Passive range of motion;Patient/family education;Scar mobilization;Ultrasound;ADLs/Self Care Home Management;Gait training;Neuromuscular re-education   PT Next Visit Plan Provide manual techniques and increase home exercise program. Increase quadricep stretching as this provided relief.    PT Home Exercise Plan See in patient instructions    Consulted and Agree with Plan of Care Patient        Problem List There are no active problems to display for this patient.  PaKerman PasseyPT, DPT   01/13/2015, 5:05 PM  CoCataioAIN REThe Aesthetic Surgery Centre PLLCERVICES [address]d[place]hone: [phone] Fax:  [phone]

## 2015-01-13 NOTE — Patient Instructions (Addendum)
All exercises provided were adapted from [email or web address]. Patient was provided a written handout with pictures as described. Any additional cues were manually entered in to handout and copied in to this document.   QUADRICEPS STRETCH - SIDELYING  While lying on your side reach back and hold your top foot and bend your knee until a stretch is felt. ** Use belt to help with stretch  ** [place]

## 2015-01-18 ENCOUNTER — Ambulatory Visit: Payer: Medicare Other

## 2015-01-18 DIAGNOSIS — R269 Unspecified abnormalities of gait and mobility: Secondary | ICD-10-CM

## 2015-01-18 DIAGNOSIS — M25561 Pain in right knee: Secondary | ICD-10-CM

## 2015-01-18 NOTE — Therapy (Addendum)
[place] MAIN [facility] SERVICES [address] [place], [place], [address] Phone: [phone]   Fax:  [phone]  Physical Therapy Treatment  Patient Details  Name: Hayley Mitchell MRN: [id number] Date of Birth: 11-Jun-1946 Referring Provider:  Hessie Knows, MD  Encounter Date: 01/18/2015      PT End of Session - 01/18/15 1239    Visit Number 13   Number of Visits 15   Date for PT Re-Evaluation 01/25/15   PT Start Time 1120   PT Stop Time 1205   PT Time Calculation (min) 45 min   Equipment Utilized During Treatment --   Activity Tolerance Patient tolerated treatment well;Patient limited by pain   Behavior During Therapy [facility] for tasks assessed/performed      Past Medical History  Diagnosis Date  . Allergy     pt denies any significant medical history to herself or family, no meds.    Past Surgical History  Procedure Laterality Date  . Cholecystectomy    . Joint replacement Left November 02, 2014    There were no vitals filed for this visit.  Visit Diagnosis:  Right knee pain  Abnormality of gait      Subjective Assessment - 01/18/15 1236    Subjective pt reports after she performed bilateral quad stretch in sidelying on Friday (6/24) she experienced increased pain in her calfs, to the point that she was not able to get out of bed or perform her exercises until yesterday.  pt relates famiily member massaged her calfs which helped decresaed the "knots" and pain.  pt reports she feels better today with pain of 5/10.    Patient is accompained by: Family member;Interpreter   Limitations Walking   Patient Stated Goals to decrease pain and increase her standing and ambulation tolerance   Pain Score 5    Pain Location Knee   Pain Orientation Right             Warm-up: nustep x 5  Manual therapy: Soft tissue mobilization (effleurage, muscle stripping, ischemic compression) to right calf to decrease pain/tighness Contract  relax gastroc and soleus stretch in prone x 6 minutes to decrease tightness  Pt experienced decreased pain in gastroc and improved gait post manual therapy   There ex: Standing bilateral Hip abduction/flexion/extenstion 2x10 with yellow theraband, pt required verbal and tactile cues to keep toes pointing forward and to maintain upright posture  Eccentric step down on airex 2x10, pt required verbal and tactile cueing to increase knee flexion Bilateral leg press 3x10 with 75#,100#, 120#  Side steps with yellow theraband 2xlength of //bars Bilateral gastroc stretch 3x30 sec, pt required verbal cues to keep toes point forward                     PT Education - 01/18/15 1238    Education provided Yes   Education Details to continue massaging calfs and stretching afterwards, plan to revise HEP next visit to prepare for discharge   Person(s) Educated Patient;Other (comment)   Methods Explanation   Comprehension Verbalized understanding             PT Long Term Goals - 12/16/14 1338    PT LONG TERM GOAL #1   Title pt will ascend/descend 4 steps in step over step pattern using 1 handrail demonstrating improved RLE strength.    Time 4   Period Weeks   Status Partially Met   PT LONG TERM GOAL #2  Title pt will squat to pick up item from the ground 3x demonstrating return to ADLs.    Time 4   Period Weeks   Status Partially Met   PT LONG TERM GOAL #3   Title pt will increase gait speed to 1.21ms demonstrating normalized home mobility.    Time 4   Period Weeks   Status On-going   PT LONG TERM GOAL #4   Title pt will ambulate 5061fwith LRAD without rest demonstrating community mobility.    Time 4   Period Weeks   Status Partially Met               Plan - 01/18/15 1240    Clinical Impression Statement pt demonstrated improved gait, deceased pain after soft tissume moblization to calf and gastroc and soleus contract relax stretch, pt was able to perform ther  ex with minimal complaint of increased pain.  pt would benefit from continued skilled PT services to improve functional mobility and decrease pain.   Pt will benefit from skilled therapeutic intervention in order to improve on the following deficits Abnormal gait;Decreased range of motion;Difficulty walking;Decreased endurance;Decreased activity tolerance;Pain;Impaired flexibility;Decreased strength   Rehab Potential Good   PT Frequency 2x / week   PT Duration 3 weeks   PT Treatment/Interventions DME Instruction;Balance training;Manual techniques;Therapeutic exercise;Electrical Stimulation;Cryotherapy;Therapeutic activities;Dry needling;Passive range of motion;Patient/family education;Scar mobilization;Ultrasound;ADLs/Self Care Home Management;Gait training;Neuromuscular re-education   PT Next Visit Plan Provide manual techniques and increase home exercise program. Increase quadricep stretching as this provided relief.    PT Home Exercise Plan See in patient instructions    Consulted and Agree with Plan of Care Patient        Problem List There are no active problems to display for this patient.  JoRenford DillsSPT 01/18/2015, 3:02 PM This entire session was performed under direct supervision and direction of a licensed therapist/therapist assistant . I have personally read, edited and approve of the note as written. AsGorden HarmsTortorici, PT, DPT #[id number] REJersey Shore Medical CenterERVICES [address]dConcord[place]hone: [phone] Fax:  [phone]

## 2015-01-20 ENCOUNTER — Ambulatory Visit: Payer: Medicare Other

## 2015-01-20 DIAGNOSIS — R269 Unspecified abnormalities of gait and mobility: Secondary | ICD-10-CM | POA: Diagnosis not present

## 2015-01-20 DIAGNOSIS — M25561 Pain in right knee: Secondary | ICD-10-CM

## 2015-01-20 NOTE — Therapy (Signed)
[place] MAIN [facility] SERVICES [address] [place], [place], [address] Phone: [phone]   Fax:  [phone]  Physical Therapy Treatment/Discharge Note  Patient Details  Name: Hayley Mitchell MRN: [id number] Date of Birth: Apr 28, 1946 Referring Provider:  Hessie Knows, MD  Encounter Date: 01/20/2015      PT End of Session - 01/20/15 1225    Visit Number 14   Number of Visits 15   Date for PT Re-Evaluation 01/25/15   PT Start Time 1115   PT Stop Time 1200   PT Time Calculation (min) 45 min   Activity Tolerance Patient tolerated treatment well;Patient limited by pain   Behavior During Therapy [facility] for tasks assessed/performed      Past Medical History  Diagnosis Date  . Allergy     pt denies any significant medical history to herself or family, no meds.    Past Surgical History  Procedure Laterality Date  . Cholecystectomy    . Joint replacement Left November 02, 2014    There were no vitals filed for this visit.  Visit Diagnosis:  Right knee pain  Abnormality of gait      Subjective Assessment - 01/20/15 1220    Subjective pt reports her right LE is doing much better today with no pain in his knee or calf.  pt relates manual therapy from last session seemed to have lasting effects.  pt reports her pain is coming from her left knee and is thinking about having TKA on the left in the near future .    Patient is accompained by: Family member;Interpreter   Limitations Walking   Patient Stated Goals to decrease pain and increase her standing and ambulation tolerance   Currently in Pain? Yes   Pain Score 8    Pain Location Knee   Pain Orientation Left      There ex: Nustep x 5 min: no charge Bridges x10 Bilateral clamshells x10 Bilateral sidelying hip abduction x10 Sit to stand x10 Stairs x 4 steps Bilateral Hip abduction/flexion/extension with red theraband x10  Pt required minimal verbal cues to perform the above  exercises with correct exercise technique   Gait speed: 0.58, with complaint of left knee pain Pt was able to ambulate 220 ft and required rest after due to 8/10 pain in her left knee                            PT Education - 01/20/15 1224    Education provided Yes   Education Details to continue HEP to help decrease knee pain and strengthen musculature to imrove potential outcome if she has a left TKA.    Person(s) Educated Patient   Methods Explanation   Comprehension Verbalized understanding             PT Long Term Goals - 01/20/15 1233    PT LONG TERM GOAL #1   Title pt will ascend/descend 4 steps in step over step pattern using 1 handrail demonstrating improved RLE strength.    Time 4   Period Weeks   Status Achieved   PT LONG TERM GOAL #2   Title pt will squat to pick up item from the ground 3x demonstrating return to ADLs.    Time 4   Period Weeks   Status Partially Met   PT LONG TERM GOAL #3   Title pt will increase gait speed to 1.93ms demonstrating normalized home mobility.  Time 4   Period Weeks   Status Partially Met   PT LONG TERM GOAL #4   Title pt will ambulate 582f with LRAD without rest demonstrating community mobility.    Time 4   Period Weeks   Status Partially Met               Plan - [id number]    Clinical Impression Statement Pt has progressed well with PT and has returned to PRLOF and partially met all her goals which primailry due to 8-9/10 pain in her left knee.  pt is able to perform all her exercises , asecend/descend stairs wihout increae in her right LE.  Since paitent is able perform functional activites without pain in her R LE she will be discharged at this time with a HEP and will resume skilled PT services if and when she has L TKA.    Pt will benefit from skilled therapeutic intervention in order to improve on the following deficits Abnormal gait;Decreased range of motion;Difficulty walking;Decreased  endurance;Decreased activity tolerance;Pain;Impaired flexibility;Decreased strength   Rehab Potential Good   PT Frequency 2x / week   PT Duration 3 weeks   PT Treatment/Interventions DME Instruction;Balance training;Manual techniques;Therapeutic exercise;Electrical Stimulation;Cryotherapy;Therapeutic activities;Dry needling;Passive range of motion;Patient/family education;Scar mobilization;Ultrasound;ADLs/Self Care Home Management;Gait training;Neuromuscular re-education   Consulted and Agree with Plan of Care Patient          G-Codes - [id number]/23/[id number]    Functional Assessment Tool Used gait speed, performance on functional activities   Functional Limitation Mobility: Walking and moving around   Mobility: Walking and Moving Around Current Status (([id number] At least 1 percent but less than 20 percent impaired, limited or restricted   Mobility: Walking and Moving Around Goal Status [phone] At least 1 percent but less than 20 percent impaired, limited or restricted   Mobility: Walking and Moving Around Discharge Status [phone] At least 1 percent but less than 20 percent impaired, limited or restricted      Problem List There are no active problems to display for this patient.   JRenford Dills SPT 607-23-16 12:49 PM This entire session was performed under direct supervision and direction of a licensed therapist/therapist assistant . I have personally read, edited and approve of the note as written. AGorden Harms Tortorici, PT, DPT #[id number] [place] [address] [place] [address]Phone: [phone]  Fax:  [phone]

## 2015-01-20 NOTE — Patient Instructions (Signed)
[email or web address] Bilateral Clamshells 2x10 Bilateral sidelying hip abduction 2x10 Bridges 2x10 Sit to stand 2x10 Bilateral hip abduction/flexion/extension with red theraband 2x10 Bilateral gastroc stretch 3x30 sec

## 2015-04-25 ENCOUNTER — Other Ambulatory Visit: Payer: Self-pay | Admitting: Physician Assistant

## 2015-04-25 DIAGNOSIS — Z0000 Encounter for general adult medical examination without abnormal findings: Secondary | ICD-10-CM

## 2015-05-23 ENCOUNTER — Ambulatory Visit: Payer: Medicare Other | Admitting: *Deleted

## 2016-06-05 ENCOUNTER — Emergency Department
Admission: EM | Admit: 2016-06-05 | Discharge: 2016-06-05 | Disposition: A | Discharge status: Home / Self Care | Payer: Medicare Other | Attending: Emergency Medicine | Admitting: Emergency Medicine

## 2016-06-05 ENCOUNTER — Emergency Department: Payer: Medicare Other

## 2016-06-05 ENCOUNTER — Encounter: Payer: Self-pay | Admitting: *Deleted

## 2016-06-05 DIAGNOSIS — M545 Low back pain, unspecified: Secondary | ICD-10-CM

## 2016-06-05 MED ORDER — PREDNISONE 10 MG (21) PO TBPK: 10.0000 mg | ORAL_TABLET | Freq: Every day | ORAL | 0 refills | Status: DC

## 2016-06-05 NOTE — ED Provider Notes (Signed)
[facility]-EMERGENCY DEPARTMENT Provider Note   CSN: [id number] Arrival date & time: 06/05/16  0902     History   Chief Complaint Chief Complaint  Patient presents with  . Back Pain    HPI Hayley AcostaMaria C Severs is a 70 y.o. female presenting to the emergency department with "low back pain" for 1 month. She is accompanied by her granddaughter. Patient has experienced back pain for the past week. She describes pain as 4/10 in intensity and stabbing. She is in a wheelchair during today's visit. However, she ordinarily ambulates at baseline with a cane. She denies radicular symptoms. Back pain is worsened with a supine position. Patient's sleep has been disturbed. She denies dysuria, incontinence and groin numbness. She has taken Tylenol for pain, which has relieved her symptoms.  HPI  Past Medical History:  Diagnosis Date  . Allergy    pt denies any significant medical history to herself or family, no meds.    There are no active problems to display for this patient.   Past Surgical History:  Procedure Laterality Date  . CHOLECYSTECTOMY    . JOINT REPLACEMENT Left November 02, 2014    OB History    No data available       Home Medications    Prior to Admission medications   Medication Sig Start Date End Date Taking? Authorizing Provider  predniSONE (STERAPRED UNI-PAK 21 TAB) 10 MG (21) TBPK tablet Take 1 tablet (10 mg total) by mouth daily. 06/05/16   Orvil FeilJaclyn M Jaelynn Currier, PA-C    Family History No family history on file.  Social History Social History  Substance Use Topics  . Smoking status: Never Smoker  . Smokeless tobacco: Not on file  . Alcohol use No     Allergies   Patient has no known allergies.   Review of Systems Review of Systems  Constitutional: Negative for chills and diaphoresis.  Respiratory: Negative for chest tightness.   Cardiovascular: Negative for chest pain.  Genitourinary: Negative for dysuria.  Musculoskeletal: Positive for back pain.  Skin:  Negative for color change.  Neurological: Negative for dizziness, weakness and numbness.   Physical Exam Updated Vital Signs BP (!) 143/71   Pulse 70   Temp 98 F (36.7 C) (Oral)   Resp 18   SpO2 96%   Physical Exam  Constitutional: She is oriented to person, place, and time. She appears well-developed and well-nourished.  HENT:  Head: Normocephalic and atraumatic.  Eyes: EOM are normal. Pupils are equal, round, and reactive to light.  Neck: Normal range of motion.  Cardiovascular: Normal rate, regular rhythm, normal heart sounds and intact distal pulses.   Pulmonary/Chest: Effort normal and breath sounds normal. No respiratory distress. She has no wheezes. She has no rales. She exhibits no tenderness.  Musculoskeletal:  Patient' back pain is worsened with flexion. She has 5 out of 5 strength in the upper and lower extremities bilaterally. Patient ambulates with 30 of flexion at baseline. She uses a cane at home. She is in a wheelchair at time of exam.  Neurological: She is alert and oriented to person, place, and time.  Patient has intact sensation to light and deep palpation.  Skin: Skin is warm and dry.  Psychiatric: She has a normal mood and affect. Her behavior is normal. Judgment and thought content normal.     ED Treatments / Results  Labs (all labs ordered are listed, but only abnormal results are displayed) Labs Reviewed - No data to display  EKG  EKG Interpretation None       Radiology Dg Lumbar Spine Complete  Result Date: 06/05/2016 CLINICAL DATA:  Low back pain radiating into both legs for 3 months. No known injury. EXAM: LUMBAR SPINE - COMPLETE 4+ VIEW COMPARISON:  Plain films lumbar spine 07/18/2014. FINDINGS: No fracture is identified. Advanced facet degenerative change and marked loss of disc space height are seen at L4-5 and L5-S1. Facet mediated 0.4 cm anterolisthesis L4 on L5 is unchanged. Mild convex right curvature with the apex at L3-4 is noted.  Paraspinous structures are unremarkable. IMPRESSION: Narrowed acute abnormality. 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 Electronically Signed   By: Drusilla Kannerhomas  Dalessio M.D.   On: 06/05/2016 11:13    Procedures Procedures (including critical care time)  Medications Ordered in ED Medications - No data to display   Initial Impression / Assessment and Plan / ED Course  I have reviewed the triage vital signs and the nursing notes.  Pertinent labs & imaging results that were available during my care of the patient were reviewed by me and considered in my medical decision making (see chart for details).  Clinical Course    Assessment and plan: Low back pain: Patient has experienced localized low back pain for one month without radicular symptoms. Lumbar x-rays indicated some arthritic changes without fracture. Patient denies groin numbness or incontinence, decreasing suspicion for cauda equina syndrome. Patient denies weakness or falls. Patient was treated with oral prednisone. She was advised to make an appointment with orthopedics (Dr. Hyacinth MeekerMiller) if low back pain persists despite steroid use.  Final Clinical Impressions(s) / ED Diagnoses   Final diagnoses:  Acute bilateral low back pain without sciatica    New Prescriptions New Prescriptions   PREDNISONE (STERAPRED UNI-PAK 21 TAB) 10 MG (21) TBPK TABLET    Take 1 tablet (10 mg total) by mouth daily.     Orvil FeilJaclyn M Jajaira Ruis, PA-C 06/05/16 1203    Sharyn CreamerMark Quale, MD 06/08/16 1020

## 2016-06-05 NOTE — ED Triage Notes (Addendum)
Pt complains of  Left sided low back pain for 1 month radiating to buttock, pt denies any other symptoms

## 2016-09-26 ENCOUNTER — Other Ambulatory Visit: Payer: Self-pay | Admitting: Family Medicine

## 2016-09-26 ENCOUNTER — Ambulatory Visit
Admission: RE | Admit: 2016-09-26 | Discharge: 2016-09-26 | Disposition: A | Discharge status: Home / Self Care | Payer: Medicare Other | Source: Ambulatory Visit | Attending: Family Medicine | Admitting: Family Medicine

## 2016-09-26 DIAGNOSIS — M5430 Sciatica, unspecified side: Secondary | ICD-10-CM

## 2016-09-26 DIAGNOSIS — M4807 Spinal stenosis, lumbosacral region: Secondary | ICD-10-CM | POA: Insufficient documentation

## 2016-09-26 DIAGNOSIS — M4316 Spondylolisthesis, lumbar region: Secondary | ICD-10-CM | POA: Insufficient documentation

## 2016-10-03 ENCOUNTER — Emergency Department: Payer: Medicare Other

## 2016-10-03 ENCOUNTER — Encounter: Payer: Self-pay | Admitting: Emergency Medicine

## 2016-10-03 ENCOUNTER — Emergency Department
Admission: EM | Admit: 2016-10-03 | Discharge: 2016-10-04 | Disposition: A | Discharge status: Home / Self Care | Payer: Medicare Other | Attending: Emergency Medicine | Admitting: Emergency Medicine

## 2016-10-03 DIAGNOSIS — M48061 Spinal stenosis, lumbar region without neurogenic claudication: Secondary | ICD-10-CM | POA: Insufficient documentation

## 2016-10-03 DIAGNOSIS — M4800 Spinal stenosis, site unspecified: Secondary | ICD-10-CM

## 2016-10-03 DIAGNOSIS — R200 Anesthesia of skin: Secondary | ICD-10-CM

## 2016-10-03 DIAGNOSIS — M5440 Lumbago with sciatica, unspecified side: Secondary | ICD-10-CM

## 2016-10-03 DIAGNOSIS — E119 Type 2 diabetes mellitus without complications: Secondary | ICD-10-CM | POA: Insufficient documentation

## 2016-10-03 LAB — URINALYSIS, COMPLETE (UACMP) WITH MICROSCOPIC
BILIRUBIN URINE: NEGATIVE
Glucose, UA: >=500 mg/dL — AB
Hgb urine dipstick: NEGATIVE
Ketones, ur: 20 mg/dL — AB
LEUKOCYTES UA: NEGATIVE
Nitrite: NEGATIVE
PH: 5 (ref 5.0–8.0)
Protein, ur: NEGATIVE mg/dL
Specific Gravity, Urine: 1.035 — ABNORMAL HIGH (ref 1.005–1.030)

## 2016-10-03 MED ORDER — MORPHINE SULFATE (PF) 2 MG/ML IV SOLN
2.0000 mg | Freq: Once | INTRAVENOUS | Status: AC
  Administered 2016-10-03: 2 mg via INTRAMUSCULAR

## 2016-10-03 MED ORDER — MORPHINE SULFATE (PF) 2 MG/ML IV SOLN
INTRAVENOUS | Status: AC
  Administered 2016-10-03: 2 mg via INTRAMUSCULAR
  Filled 2016-10-03: qty 1

## 2016-10-03 NOTE — ED Triage Notes (Addendum)
Pt presents to ED after she was seen by [facility] today with pain to her lower back and tingling that radiates from her lower back to her legs. Has been dx with having "discs out of place". Pt was sent to have an MRI to see severity of the disc displacement. Ambulatory with cane to triage. Symptoms have been ongoing for the past month. Pt denies difficulty controlling her bowel or bladder. No known injury. Pt does reports feeling the urge to urinate more frequently.

## 2016-10-03 NOTE — ED Provider Notes (Signed)
[facility] Emergency Department Provider Note  ____________________________________________   I have reviewed the triage vital signs and the nursing notes.   HISTORY  Chief Complaint Back Pain   History limited by: Language [facility] utilized   HPI Hayley AcostaMaria C Mitchell is a 71 y.o. female who presents to the emergency department today from primary care doctor's office because of concerning symptoms of cauda equina. Patient has had lower back pain for the past few months. She has been diagnosed with disc herniation. However when talking to her primary care doctor today she mentioned that she has been having numbness to her genitals. She describes that she wipes much harder because she cannot feel to the point that she is scratching herself. She has felt the numbness to her genitals for the past two months. Additionally she has felt like something is blocking her urination. The patient denies any change in ambulation. Denies any trauma.    Past Medical History:  Diagnosis Date  . Allergy    pt denies any significant medical history to herself or family, no meds.    There are no active problems to display for this patient.   Past Surgical History:  Procedure Laterality Date  . CHOLECYSTECTOMY    . JOINT REPLACEMENT Left November 02, 2014    Prior to Admission medications   Medication Sig Start Date End Date Taking? Authorizing Provider  predniSONE (STERAPRED UNI-PAK 21 TAB) 10 MG (21) TBPK tablet Take 1 tablet (10 mg total) by mouth daily. 06/05/16   Orvil FeilJaclyn M Woods, PA-C    Allergies Patient has no known allergies.  No family history on file.  Social History Social History  Substance Use Topics  . Smoking status: Never Smoker  . Smokeless tobacco: Not on file  . Alcohol use No    Review of Systems  Constitutional: Negative for fever. Cardiovascular: Negative for chest pain. Respiratory: Negative for shortness of  breath. Gastrointestinal: Negative for abdominal pain, vomiting and diarrhea. Genitourinary: Negative for dysuria. Musculoskeletal: Positive for low back pain. Skin: Negative for rash. Neurological: Positive for numbness to genitals.   10-point ROS otherwise negative.  ____________________________________________   PHYSICAL EXAM:  VITAL SIGNS: ED Triage Vitals  Enc Vitals Group     BP 10/03/16 2004 138/75     Pulse Rate 10/03/16 2004 92     Resp 10/03/16 2004 20     Temp 10/03/16 2004 98.9 F (37.2 C)     Temp Source 10/03/16 2004 Oral     SpO2 10/03/16 2004 94 %     Weight 10/03/16 2005 180 lb (81.6 kg)     Height 10/03/16 2005 5\' 2"  (1.575 m)     Head Circumference --      Peak Flow --      Pain Score 10/03/16 2006 8   Constitutional: Alert and oriented. Well appearing and in no distress. Eyes: Conjunctivae are normal. Normal extraocular movements. ENT   Head: Normocephalic and atraumatic.   Nose: No congestion/rhinnorhea.   Mouth/Throat: Mucous membranes are moist.   Neck: No stridor. Hematological/Lymphatic/Immunilogical: No cervical lymphadenopathy. Cardiovascular: Normal rate, regular rhythm.  No murmurs, rubs, or gallops.  Respiratory: Normal respiratory effort without tachypnea nor retractions. Breath sounds are clear and equal bilaterally. No wheezes/rales/rhonchi. Gastrointestinal: Soft and non tender. No rebound. No guarding.  Genitourinary: Deferred Musculoskeletal: Normal range of motion in all extremities. No lower extremity edema. Neurologic:  Normal speech and language. Strength 5/5 in bilateral legs. Sensation grossly intact.  Skin:  Skin is warm, dry and intact. No rash noted. Psychiatric: Mood and affect are normal. Speech and behavior are normal. Patient exhibits appropriate insight and judgment.  ____________________________________________    LABS (pertinent positives/negatives)  Labs Reviewed  URINALYSIS, COMPLETE (UACMP) WITH  MICROSCOPIC - Abnormal; Notable for the following:       Result Value   Color, Urine STRAW (*)    APPearance CLEAR (*)    Specific Gravity, Urine 1.035 (*)    Glucose, UA >=500 (*)    Ketones, ur 20 (*)    Bacteria, UA RARE (*)    Squamous Epithelial / LPF 0-5 (*)    All other components within normal limits     ____________________________________________   EKG  None  ____________________________________________    RADIOLOGY  MRI IMPRESSION: Worsening grade 1 L4-5 anterolisthesis, no definite spondylolysis though CT would be confirmatory.  Severe canal stenosis L4-5, minimal at L3-4.  Neural foraminal narrowing L3-4 through L5-S1: Moderate to severe at L4-5 and L5-S1.  Partially characterized severe LEFT T10-11 neural foraminal narrowing.  ____________________________________________   PROCEDURES  Procedures  ____________________________________________   INITIAL IMPRESSION / ASSESSMENT AND PLAN / ED COURSE  Pertinent labs & imaging results that were available during my care of the patient were reviewed by me and considered in my medical decision making (see chart for details).  Patient presented to the emergency department today from primary care because of concerns for cauda equina. Patient does have some concerning symptoms including what sounds like some urinary hesitancy as well as saddle anesthesia although this has been going on for a few months. MRI shows some spinal canal stenosis however compression. Will plan on giving course of steroids. Will give neurosurgery follow up information.  ____________________________________________   FINAL CLINICAL IMPRESSION(S) / ED DIAGNOSES  Final diagnoses:  Saddle anesthesia  Low back pain with sciatica, sciatica laterality unspecified, unspecified back pain laterality, unspecified chronicity  Central stenosis of spinal canal     Note: This dictation was prepared with Dragon dictation. Any  transcriptional errors that result from this process are unintentional     Phineas Semen, MD 10/04/16 [phone]

## 2016-10-03 NOTE — ED Notes (Signed)
Patient transported to MRI 

## 2016-10-04 MED ORDER — PREDNISONE 10 MG (21) PO TBPK: ORAL_TABLET | ORAL | 0 refills | Status: DC

## 2016-10-04 MED ORDER — TRAMADOL HCL 50 MG PO TABS: 50.0000 mg | ORAL_TABLET | Freq: Four times a day (QID) | ORAL | 0 refills | Status: AC | PRN

## 2016-10-04 NOTE — ED Notes (Signed)
ED Provider at bedside. 

## 2016-10-04 NOTE — Discharge Instructions (Signed)
Please seek medical attention for any high fevers, chest pain, shortness of breath, change in behavior, persistent vomiting, bloody stool or any other new or concerning symptoms.  

## 2016-10-07 ENCOUNTER — Emergency Department: Payer: Medicare Other

## 2016-10-07 ENCOUNTER — Encounter: Payer: Self-pay | Admitting: Emergency Medicine

## 2016-10-07 ENCOUNTER — Inpatient Hospital Stay
Admission: EM | Admit: 2016-10-07 | Discharge: 2016-10-10 | DRG: 194 | Disposition: A | Discharge status: Home / Self Care | Payer: Medicare Other | Attending: Internal Medicine | Admitting: Internal Medicine

## 2016-10-07 DIAGNOSIS — T380X5A Adverse effect of glucocorticoids and synthetic analogues, initial encounter: Secondary | ICD-10-CM | POA: Diagnosis present

## 2016-10-07 DIAGNOSIS — Z6833 Body mass index (BMI) 33.0-33.9, adult: Secondary | ICD-10-CM | POA: Diagnosis not present

## 2016-10-07 DIAGNOSIS — Z23 Encounter for immunization: Secondary | ICD-10-CM | POA: Diagnosis present

## 2016-10-07 DIAGNOSIS — M4800 Spinal stenosis, site unspecified: Secondary | ICD-10-CM | POA: Diagnosis present

## 2016-10-07 DIAGNOSIS — J189 Pneumonia, unspecified organism: Principal | ICD-10-CM | POA: Diagnosis present

## 2016-10-07 DIAGNOSIS — R059 Cough, unspecified: Secondary | ICD-10-CM

## 2016-10-07 DIAGNOSIS — N320 Bladder-neck obstruction: Secondary | ICD-10-CM

## 2016-10-07 DIAGNOSIS — E1165 Type 2 diabetes mellitus with hyperglycemia: Secondary | ICD-10-CM | POA: Diagnosis present

## 2016-10-07 DIAGNOSIS — R32 Unspecified urinary incontinence: Secondary | ICD-10-CM | POA: Diagnosis present

## 2016-10-07 DIAGNOSIS — E871 Hypo-osmolality and hyponatremia: Secondary | ICD-10-CM | POA: Diagnosis present

## 2016-10-07 DIAGNOSIS — Z833 Family history of diabetes mellitus: Secondary | ICD-10-CM | POA: Diagnosis not present

## 2016-10-07 DIAGNOSIS — G8929 Other chronic pain: Secondary | ICD-10-CM | POA: Diagnosis present

## 2016-10-07 DIAGNOSIS — R05 Cough: Secondary | ICD-10-CM | POA: Diagnosis present

## 2016-10-07 DIAGNOSIS — E118 Type 2 diabetes mellitus with unspecified complications: Secondary | ICD-10-CM

## 2016-10-07 HISTORY — DX: Type 2 diabetes mellitus without complications: E11.9

## 2016-10-07 HISTORY — DX: Other chronic pain: G89.29

## 2016-10-07 HISTORY — DX: Dorsalgia, unspecified: M54.9

## 2016-10-07 LAB — BASIC METABOLIC PANEL
ANION GAP: 18 — AB (ref 5–15)
BUN: 16 mg/dL (ref 6–20)
CO2: 17 mmol/L — ABNORMAL LOW (ref 22–32)
Calcium: 9.3 mg/dL (ref 8.9–10.3)
Chloride: 94 mmol/L — ABNORMAL LOW (ref 101–111)
Creatinine, Ser: 0.64 mg/dL (ref 0.44–1.00)
GFR calc Af Amer: >60 mL/min (ref >60)
Glucose, Bld: 385 mg/dL — ABNORMAL HIGH (ref 65–99)
POTASSIUM: 3.7 mmol/L (ref 3.5–5.1)
SODIUM: 129 mmol/L — AB (ref 135–145)

## 2016-10-07 LAB — CBC WITH DIFFERENTIAL/PLATELET
BASOS ABS: 0.1 10*3/uL (ref 0–0.1)
Basophils Relative: 0 %
EOS ABS: 0 10*3/uL (ref 0–0.7)
EOS PCT: 0 %
HCT: 46 % (ref 35.0–47.0)
Hemoglobin: 15.4 g/dL (ref 12.0–16.0)
LYMPHS PCT: 10 %
Lymphs Abs: 2.2 10*3/uL (ref 1.0–3.6)
MCH: 28.5 pg (ref 26.0–34.0)
MCHC: 33.4 g/dL (ref 32.0–36.0)
MCV: 85.3 fL (ref 80.0–100.0)
Monocytes Absolute: 1.9 10*3/uL — ABNORMAL HIGH (ref 0.2–0.9)
Monocytes Relative: 8 %
Neutro Abs: 18.8 10*3/uL — ABNORMAL HIGH (ref 1.4–6.5)
Neutrophils Relative %: 82 %
PLATELETS: 399 10*3/uL (ref 150–440)
RBC: 5.39 MIL/uL — AB (ref 3.80–5.20)
RDW: 14 % (ref 11.5–14.5)
WBC: 23 10*3/uL — ABNORMAL HIGH (ref 3.6–11.0)

## 2016-10-07 LAB — URINALYSIS, COMPLETE (UACMP) WITH MICROSCOPIC
Bacteria, UA: NONE SEEN
Bilirubin Urine: NEGATIVE
HGB URINE DIPSTICK: NEGATIVE
KETONES UR: 80 mg/dL — AB
LEUKOCYTES UA: NEGATIVE
Nitrite: NEGATIVE
PROTEIN: 30 mg/dL — AB
Specific Gravity, Urine: 1.036 — ABNORMAL HIGH (ref 1.005–1.030)
pH: 5 (ref 5.0–8.0)

## 2016-10-07 LAB — SODIUM, URINE, RANDOM: SODIUM UR: 23 mmol/L

## 2016-10-07 LAB — GLUCOSE, CAPILLARY
GLUCOSE-CAPILLARY: 352 mg/dL — AB (ref 65–99)
Glucose-Capillary: 391 mg/dL — ABNORMAL HIGH (ref 65–99)

## 2016-10-07 LAB — OSMOLALITY: OSMOLALITY: 295 mosm/kg (ref 275–295)

## 2016-10-07 LAB — LACTIC ACID, PLASMA: Lactic Acid, Venous: 1.1 mmol/L (ref 0.5–1.9)

## 2016-10-07 LAB — OSMOLALITY, URINE: OSMOLALITY UR: 877 mosm/kg (ref 300–900)

## 2016-10-07 MED ORDER — SODIUM CHLORIDE 0.9 % IV SOLN
INTRAVENOUS | Status: DC
  Administered 2016-10-07: 75 mL/h via INTRAVENOUS
  Administered 2016-10-08: 14:00:00 via INTRAVENOUS

## 2016-10-07 MED ORDER — TRAMADOL HCL 50 MG PO TABS
50.0000 mg | ORAL_TABLET | Freq: Four times a day (QID) | ORAL | Status: DC | PRN
  Administered 2016-10-07 – 2016-10-10 (×4): 50 mg via ORAL
  Filled 2016-10-07 (×4): qty 1

## 2016-10-07 MED ORDER — SODIUM CHLORIDE 0.9 % IV BOLUS (SEPSIS)
1000.0000 mL | Freq: Once | INTRAVENOUS | Status: AC
  Administered 2016-10-07: 1000 mL via INTRAVENOUS

## 2016-10-07 MED ORDER — HEPARIN SODIUM (PORCINE) 5000 UNIT/ML IJ SOLN
5000.0000 [IU] | Freq: Three times a day (TID) | INTRAMUSCULAR | Status: DC
  Administered 2016-10-07 – 2016-10-10 (×8): 5000 [IU] via SUBCUTANEOUS
  Filled 2016-10-07 (×8): qty 1

## 2016-10-07 MED ORDER — INFLUENZA VAC SPLIT QUAD 0.5 ML IM SUSY
0.5000 mL | PREFILLED_SYRINGE | INTRAMUSCULAR | Status: AC
  Administered 2016-10-09: 0.5 mL via INTRAMUSCULAR
  Filled 2016-10-07: qty 0.5

## 2016-10-07 MED ORDER — ACETAMINOPHEN 650 MG RE SUPP: 650.0000 mg | Freq: Four times a day (QID) | RECTAL | Status: DC | PRN

## 2016-10-07 MED ORDER — IPRATROPIUM-ALBUTEROL 0.5-2.5 (3) MG/3ML IN SOLN
3.0000 mL | RESPIRATORY_TRACT | Status: DC | PRN
Start: 2016-10-07 — End: 2016-10-10

## 2016-10-07 MED ORDER — INSULIN ASPART 100 UNIT/ML ~~LOC~~ SOLN
0.0000 [IU] | Freq: Every day | SUBCUTANEOUS | Status: DC
  Administered 2016-10-07: 5 [IU] via SUBCUTANEOUS
  Administered 2016-10-08: 2 [IU] via SUBCUTANEOUS
  Administered 2016-10-09: 3 [IU] via SUBCUTANEOUS
  Filled 2016-10-07: qty 5
  Filled 2016-10-07: qty 3
  Filled 2016-10-07: qty 2

## 2016-10-07 MED ORDER — LEVOFLOXACIN IN D5W 750 MG/150ML IV SOLN
750.0000 mg | INTRAVENOUS | Status: DC
  Administered 2016-10-08 – 2016-10-09 (×2): 750 mg via INTRAVENOUS
  Filled 2016-10-07 (×3): qty 150

## 2016-10-07 MED ORDER — INSULIN ASPART 100 UNIT/ML ~~LOC~~ SOLN
0.0000 [IU] | Freq: Three times a day (TID) | SUBCUTANEOUS | Status: DC
  Administered 2016-10-08: 5 [IU] via SUBCUTANEOUS
  Administered 2016-10-08: 11 [IU] via SUBCUTANEOUS
  Administered 2016-10-08: 8 [IU] via SUBCUTANEOUS
  Administered 2016-10-09: 11 [IU] via SUBCUTANEOUS
  Administered 2016-10-09: 5 [IU] via SUBCUTANEOUS
  Administered 2016-10-09: 8 [IU] via SUBCUTANEOUS
  Administered 2016-10-10: 3 [IU] via SUBCUTANEOUS
  Administered 2016-10-10: 5 [IU] via SUBCUTANEOUS
  Filled 2016-10-07 (×2): qty 5
  Filled 2016-10-07: qty 8
  Filled 2016-10-07: qty 3
  Filled 2016-10-07: qty 8
  Filled 2016-10-07 (×2): qty 11
  Filled 2016-10-07: qty 5

## 2016-10-07 MED ORDER — ACETAMINOPHEN 325 MG PO TABS
650.0000 mg | ORAL_TABLET | Freq: Four times a day (QID) | ORAL | Status: DC | PRN
  Administered 2016-10-07: 650 mg via ORAL
  Filled 2016-10-07: qty 2

## 2016-10-07 MED ORDER — LEVOFLOXACIN IN D5W 750 MG/150ML IV SOLN
750.0000 mg | Freq: Once | INTRAVENOUS | Status: AC
  Administered 2016-10-07: 750 mg via INTRAVENOUS
  Filled 2016-10-07: qty 150

## 2016-10-07 NOTE — ED Provider Notes (Signed)
[facility] Emergency Department Provider Note   ____________________________________________   I have reviewed the triage vital signs and the nursing notes.   HISTORY  Chief Complaint Generalized Body Aches; Cough; Fever; and Hyperglycemia   History limited by: Language [facility] Interpreter utilized   HPI Hayley Mitchell is a 71 y.o. female who presents to the emergency department today because of concern for fevers, feeling unwell. These symptoms started three days ago. Of note the patient was seen in the emergency department at that time for lower back pain. The patient has continued to have low back pain. The patient does not know how high the fever has gotten. She did take some ibuprofen yesterday but no medications today. The patient has also had some associated cough. She has had chest pain.    Past Medical History:  Diagnosis Date  . Allergy    pt denies any significant medical history to herself or family, no meds.  . Chronic back pain   . Diabetes mellitus without complication (HCC)     There are no active problems to display for this patient.   Past Surgical History:  Procedure Laterality Date  . CHOLECYSTECTOMY    . JOINT REPLACEMENT Left November 02, 2014    Prior to Admission medications   Medication Sig Start Date End Date Taking? Authorizing Provider  predniSONE (STERAPRED UNI-PAK 21 TAB) 10 MG (21) TBPK tablet Take 1 tablet (10 mg total) by mouth daily. 06/05/16   Orvil Feil, PA-C  predniSONE (STERAPRED UNI-PAK 21 TAB) 10 MG (21) TBPK tablet As instructed on packaging. 10/04/16   Phineas Semen, MD  traMADol (ULTRAM) 50 MG tablet Take 1 tablet (50 mg total) by mouth every 6 (six) hours as needed. 10/04/16 10/04/17  Phineas Semen, MD    Allergies Patient has no known allergies.  No family history on file.  Social History Social History  Substance Use Topics  . Smoking status: Never Smoker  . Smokeless tobacco:  Never Used  . Alcohol use No    Review of Systems  Constitutional: Positive for subjective fever.  Cardiovascular: Positive for chest pain. Respiratory: Positive for shortness of breath and cough. Gastrointestinal: Negative for abdominal pain, vomiting and diarrhea. Genitourinary: Negative for dysuria. Musculoskeletal: Positive for back pain. Neurological: Negative for headaches, focal weakness or numbness.  10-point ROS otherwise negative.  ____________________________________________   PHYSICAL EXAM:  VITAL SIGNS: ED Triage Vitals  Enc Vitals Group     BP 10/07/16 1505 (!) 108/49     Pulse Rate 10/07/16 1455 99     Resp 10/07/16 1455 20     Temp 10/07/16 1455 98.2 F (36.8 C)     Temp Source 10/07/16 1455 Oral     SpO2 10/07/16 1455 92 %     Weight 10/07/16 1506 180 lb (81.6 kg)     Height 10/07/16 1506 5\' 2"  (1.575 m)     Head Circumference --      Peak Flow --      Pain Score 10/07/16 1506 10   Constitutional: Alert and oriented.  Eyes: Conjunctivae are normal. Normal extraocular movements. ENT   Head: Normocephalic and atraumatic.   Nose: No congestion/rhinnorhea.   Mouth/Throat: Mucous membranes are moist.   Neck: No stridor. Hematological/Lymphatic/Immunilogical: No cervical lymphadenopathy. Cardiovascular: Tachycardic, regular rhythm.  No murmurs, rubs, or gallops.  Respiratory: Normal respiratory effort without tachypnea nor retractions. Breath sounds are clear and equal bilaterally. No wheezes/rales/rhonchi. Gastrointestinal: Soft and non tender. No rebound.  No guarding.  Genitourinary: Deferred Musculoskeletal: Normal range of motion in all extremities. No lower extremity edema. Neurologic:  Normal speech and language. No gross focal neurologic deficits are appreciated.  Skin:  Skin is warm, dry and intact. No rash noted. Psychiatric: Mood and affect are normal. Speech and behavior are normal. Patient exhibits appropriate insight and  judgment.  ____________________________________________    LABS (pertinent positives/negatives)  Labs Reviewed  GLUCOSE, CAPILLARY - Abnormal; Notable for the following:       Result Value   Glucose-Capillary 352 (*)    All other components within normal limits  CBC WITH DIFFERENTIAL/PLATELET - Abnormal; Notable for the following:    WBC 23.0 (*)    RBC 5.39 (*)    Neutro Abs 18.8 (*)    Monocytes Absolute 1.9 (*)    All other components within normal limits  BASIC METABOLIC PANEL - Abnormal; Notable for the following:    Sodium 129 (*)    Chloride 94 (*)    CO2 17 (*)    Glucose, Bld 385 (*)    Anion gap 18 (*)    All other components within normal limits  URINALYSIS, COMPLETE (UACMP) WITH MICROSCOPIC - Abnormal; Notable for the following:    Color, Urine YELLOW (*)    APPearance CLEAR (*)    Specific Gravity, Urine 1.036 (*)    Glucose, UA >=500 (*)    Ketones, ur 80 (*)    Protein, ur 30 (*)    Squamous Epithelial / LPF 0-5 (*)    All other components within normal limits  CULTURE, BLOOD (ROUTINE X 2)  CULTURE, BLOOD (ROUTINE X 2)  LACTIC ACID, PLASMA     ____________________________________________   EKG  None  ____________________________________________    RADIOLOGY  CXR IMPRESSION: Infiltrate in the right middle and lower lobes most consistent with pneumonia. Recommend follow-up to resolution.   ____________________________________________   PROCEDURES  Procedures  ____________________________________________   INITIAL IMPRESSION / ASSESSMENT AND PLAN / ED COURSE  Pertinent labs & imaging results that were available during my care of the patient were reviewed by me and considered in my medical decision making (see chart for details).  Patient presented to the emergency department with 3 days' history of subjective fever, feeling unwell and cough. Chest x-ray was concerning for pneumonia. Patient was given IV and Vioxx will be admitted to  the hospital service. Additionally patient has significant leukocytosis and hyperglycemia. Some of this might be due to the steroids patient was prescribed for back pain.  ____________________________________________   FINAL CLINICAL IMPRESSION(S) / ED DIAGNOSES  Final diagnoses:  Cough  Community acquired pneumonia of right lung, unspecified part of lung     Note: This dictation was prepared with Nurse, children'sDragon dictation. Any transcriptional errors that result from this process are unintentional     Phineas SemenGraydon Aaleyah Witherow, MD 10/07/16 1904

## 2016-10-07 NOTE — ED Notes (Signed)
Pt's CBG 352 pt reports she has not taken hr medications as prescribed

## 2016-10-07 NOTE — ED Notes (Signed)
ED Provider at bedside with this RN and interpretor

## 2016-10-07 NOTE — ED Notes (Signed)
Patient transported to X-ray 

## 2016-10-07 NOTE — ED Triage Notes (Signed)
Pt is Spanish speaking needs interpreter. Pt reports she has been dealing with fever and generalized body aches for the past 3 days, daughter reports she gave pt Ibuprofen yesterday for fever reports feeling hot to touch, pt reports did not check temperature. Pt reports dry cough, denies any n/v/d. Decreased appetite. Pt reports just drinking water. Pt moaning while in triage

## 2016-10-07 NOTE — Progress Notes (Signed)
New Admission Note:   Arrival Method: per stretcher from ED, pt came from home with daughter Mental Orientation: alert and oriented X4, needs Spanish interpreter Telemetry: none ordered Assessment: Completed Skin: warm, dry, intact, no preexisting wound/sore noted IV: G20 [facility] the right [facility] with transparent dressing, intact Pain: 8/10 scale lower back pain, chronic per pt and daughter, will page MD for PRN medicine Safety Measures: Safety Fall Prevention Plan has been given and discussed with pt and family Admission: Completed 1A Orientation: Patient has been oriented to the room, unit and staff.  Family: daughter and granddaughter who serves as interpreter at bedside  Orders have been reviewed and implemented. Will continue to monitor patient. Call light has been placed within reach and bed alarm has been activated.   Janice NorrieAnessa Leetta Hendriks BSN, RN [facility] 1A

## 2016-10-07 NOTE — H&P (Signed)
PCP:   Resa MinerRONK, CHRISTINA O, MD   Chief Complaint:  Shortness of breath  HPI: This is a 71 year old  spanish speaking only female who has had a fever for the past 2 days. She is a nonproductive cough, generalized body aches. She is being for a week and now unable to stand. She denies any nausea or vomiting. She denies any wheezing or significant shortness of breath. She's been moaning and groaning but this is due mainly to her back pain. She has chronic back pain for several months. She's had urinary incontinence the past month and difficulty walking. She finally came to the ER. In the ER chest x-ray reveals pneumonia. The patient has leukocytosis of 23 however she is on steroids. She was here in the 13th with complaints of back pain. MRI done reveals severe spinal stenosis. She was started on steroids at bedtime. History and translation provided by granddaughter is present at bedside. Her daughter is also present at bedside.  Review of Systems:  The patient denies anorexia, fever, weight loss,, vision loss, decreased hearing, hoarseness, chest pain, syncope, dyspnea on exertion, cough, weakness, peripheral edema, balance deficits, hemoptysis, abdominal pain, melena, hematochezia, severe indigestion/heartburn, hematuria, incontinence, genital sores, muscle weakness, back pain, incontinence, suspicious skin lesions, transient blindness, difficulty walking, depression, unusual weight change, abnormal bleeding, enlarged lymph nodes, angioedema, and breast masses.  Past Medical History: Past Medical History:  Diagnosis Date  . Allergy    pt denies any significant medical history to herself or family, no meds.  . Chronic back pain   . Diabetes mellitus without complication [facility](HCC)    Past Surgical History:  Procedure Laterality Date  . CHOLECYSTECTOMY    . JOINT REPLACEMENT Left November 02, 2014    Medications: Prior to Admission medications   Medication Sig Start Date End Date Taking? Authorizing  Provider  predniSONE (STERAPRED UNI-PAK 21 TAB) 10 MG (21) TBPK tablet Take 1 tablet (10 mg total) by mouth daily. 06/05/16   Orvil FeilJaclyn M Woods, PA-C  predniSONE (STERAPRED UNI-PAK 21 TAB) 10 MG (21) TBPK tablet As instructed on packaging. 10/04/16   Phineas SemenGraydon Goodman, MD  traMADol (ULTRAM) 50 MG tablet Take 1 tablet (50 mg total) by mouth every 6 (six) hours as needed. 10/04/16 10/04/17  Phineas SemenGraydon Goodman, MD    Allergies:  No Known Allergies  Social History:  reports that she has never smoked. She has never used smokeless tobacco. She reports that she does not drink alcohol. Her drug history is not on file.  Family History: Diabetes mellitus  Physical Exam: Vitals:   10/07/16 1455 10/07/16 1505 10/07/16 1506 10/07/16 1600  BP:  (!) 108/49  129/69  Pulse: 99 (!) 103  94  Resp: 20 20  (!) 31  Temp: 98.2 F (36.8 C) 99.6 F (37.6 C)    TempSrc: Oral Oral    SpO2: 92% 99%  96%  Weight:   81.6 kg (180 lb)   Height:   5\' 2"  (1.575 m)     General:  Alert and oriented times three, well developed. ill-appearing female. Morbidly obese Eyes: PERRLA, pink conjunctiva, no scleral icterus ENT: Moist oral mucosa, neck supple, no thyromegaly Lungs: clear to ascultation, no wheeze, no crackles, no use of accessory muscles Cardiovascular: regular rate and rhythm, no regurgitation, no gallops, no murmurs. No carotid bruits, no JVD Abdomen: soft, positive BS, non-tender, non-distended, no organomegaly, not an acute abdomen GU: not examined Neuro: CN II - XII grossly intact, sensation intact Musculoskeletal: strength 5/5 all extremities,  no clubbing, cyanosis or edema Skin: no rash, no subcutaneous crepitation, no decubitus Psych: appropriate patient   Labs on Admission:   Recent Labs  10/07/16 1525  NA 129*  K 3.7  CL 94*  CO2 17*  GLUCOSE 385*  BUN 16  CREATININE 0.64  CALCIUM 9.3   No results for input(s): AST, ALT, ALKPHOS, BILITOT, PROT, ALBUMIN in the last 72 hours. No results for  input(s): LIPASE, AMYLASE in the last 72 hours.  Recent Labs  10/07/16 1525  WBC 23.0*  NEUTROABS 18.8*  HGB 15.4  HCT 46.0  MCV 85.3  PLT 399   No results for input(s): CKTOTAL, CKMB, CKMBINDEX, TROPONINI in the last 72 hours. Invalid input(s): POCBNP No results for input(s): DDIMER in the last 72 hours. No results for input(s): HGBA1C in the last 72 hours. No results for input(s): CHOL, HDL, LDLCALC, TRIG, CHOLHDL, LDLDIRECT in the last 72 hours. No results for input(s): TSH, T4TOTAL, T3FREE, THYROIDAB in the last 72 hours.  Invalid input(s): FREET3 No results for input(s): VITAMINB12, FOLATE, FERRITIN, TIBC, IRON, RETICCTPCT in the last 72 hours.  Micro Results: No results found for this or any previous visit (from the past 240 hour(s)).   Radiological Exams on Admission: Dg Chest 2 View  Result Date: 10/07/2016 CLINICAL DATA:  Cough and fever for 3 days. EXAM: CHEST  2 VIEW COMPARISON:  None. FINDINGS: There is infiltrate in the right middle and lower lobes. The left lung is clear. Mild cardiomegaly. The hila and mediastinum are unremarkable. No pneumothorax. IMPRESSION: Infiltrate in the right middle and lower lobes most consistent with pneumonia. Recommend follow-up to resolution. Electronically Signed   By: Gerome Sam III M.D   On: 10/07/2016 16:53    Assessment/Plan Present on Admission: . Community acquired pneumonia -Admit to MedSurg -Blood cultures 2 done -IV Rocephin daily. Will add Tamiflu -Duonebs, oxygen to keep sats greater 88%  . Hyponatremia -Likely due to patient's pneumonia -Will check a TSH, urine and plasma osmolality -IV fluids ordered -Repeat BMP in a.m.  Spinal stenosis/back pain -ER physician discussed patient with neurosurgery who recommends outpatient follow-up -Pain medications PRN  Diabetes mellitus -ADA diet, sliding-scale insulin. Patient not on diabetic medications prior to admission.   Deondrick Searls 10/07/2016, 6:53 PM

## 2016-10-08 ENCOUNTER — Inpatient Hospital Stay: Payer: Medicare Other

## 2016-10-08 LAB — BASIC METABOLIC PANEL
ANION GAP: 14 (ref 5–15)
BUN: 10 mg/dL (ref 6–20)
CALCIUM: 8.5 mg/dL — AB (ref 8.9–10.3)
CO2: 17 mmol/L — ABNORMAL LOW (ref 22–32)
Chloride: 100 mmol/L — ABNORMAL LOW (ref 101–111)
Creatinine, Ser: 0.51 mg/dL (ref 0.44–1.00)
GFR calc Af Amer: >60 mL/min (ref >60)
GLUCOSE: 258 mg/dL — AB (ref 65–99)
POTASSIUM: 3.3 mmol/L — AB (ref 3.5–5.1)
SODIUM: 131 mmol/L — AB (ref 135–145)

## 2016-10-08 LAB — GLUCOSE, CAPILLARY
GLUCOSE-CAPILLARY: 306 mg/dL — AB (ref 65–99)
GLUCOSE-CAPILLARY: 273 mg/dL — AB (ref 65–99)
GLUCOSE-CAPILLARY: 242 mg/dL — AB (ref 65–99)
Glucose-Capillary: 337 mg/dL — ABNORMAL HIGH (ref 65–99)
Glucose-Capillary: 222 mg/dL — ABNORMAL HIGH (ref 65–99)

## 2016-10-08 LAB — CBC
HCT: 41.1 % (ref 35.0–47.0)
HEMOGLOBIN: 13.6 g/dL (ref 12.0–16.0)
MCH: 28.3 pg (ref 26.0–34.0)
MCHC: 33 g/dL (ref 32.0–36.0)
MCV: 85.8 fL (ref 80.0–100.0)
Platelets: 337 10*3/uL (ref 150–440)
RBC: 4.79 MIL/uL (ref 3.80–5.20)
RDW: 13.8 % (ref 11.5–14.5)
WBC: 18.7 10*3/uL — ABNORMAL HIGH (ref 3.6–11.0)

## 2016-10-08 LAB — INFLUENZA PANEL BY PCR (TYPE A & B)
INFLAPCR: NEGATIVE
Influenza B By PCR: NEGATIVE

## 2016-10-08 LAB — TSH: TSH: 0.426 u[IU]/mL (ref 0.350–4.500)

## 2016-10-08 LAB — MAGNESIUM: Magnesium: 1.6 mg/dL — ABNORMAL LOW (ref 1.7–2.4)

## 2016-10-08 MED ORDER — GUAIFENESIN 100 MG/5ML PO SOLN
10.0000 mL | Freq: Four times a day (QID) | ORAL | Status: DC | PRN
  Administered 2016-10-08: 200 mg via ORAL
  Filled 2016-10-08 (×3): qty 10

## 2016-10-08 NOTE — Progress Notes (Signed)
Called to MD, no foreign body noted by this Clinical research associatewriter, patient reluctant to be examined, granddaughter stated constant feeling. Patient spanish speaking only.

## 2016-10-08 NOTE — Progress Notes (Signed)
[employer] [facility] [place] [facility]   PATIENT NAME: Hayley Mitchell    MR#:  [id number]  DATE OF BIRTH:  03-10-1946  SUBJECTIVE:  CHIEF COMPLAINT:   Chief Complaint  Patient presents with  . Generalized Body Aches  . Cough  . Fever  . Hyperglycemia    Came with fever and Cough for last few days, found to have pneumonia on chest x-ray and with antibiotic for one day she feels better today.  She has chronic complain of lower back pain and going with urinary incontinence and requiring oral steroid every day to help with that.  REVIEW OF SYSTEMS:  CONSTITUTIONAL: No fever, fatigue or weakness.  EYES: No blurred or double vision.  EARS, NOSE, AND THROAT: No tinnitus or ear pain.  RESPIRATORY: positive for cough, shortness of breath, wheezing or hemoptysis.  CARDIOVASCULAR: No chest pain, orthopnea, edema.  GASTROINTESTINAL: No nausea, vomiting, diarrhea or abdominal pain.  GENITOURINARY: No dysuria, hematuria. c/o something coming out from her vagina everytime she urinates. ENDOCRINE: No polyuria, nocturia,  HEMATOLOGY: No anemia, easy bruising or bleeding SKIN: No rash or lesion. MUSCULOSKELETAL: No joint pain or arthritis.  Low back pain NEUROLOGIC: No tingling, numbness, weakness.  PSYCHIATRY: No anxiety or depression.   ROS  DRUG ALLERGIES:  No Known Allergies  VITALS:  Blood pressure 137/66, pulse 95, temperature 98.2 F (36.8 C), temperature source Oral, resp. rate 20, height 5\' 2"  (1.575 m), weight 82 kg (180 lb 11.2 oz), SpO2 96 %.  PHYSICAL EXAMINATION:  GENERAL:  71 y.o.-year-old patient lying in the bed with no acute distress.  EYES: Pupils equal, round, reactive to light and accommodation. No scleral icterus. Extraocular muscles intact.  HEENT: Head atraumatic, normocephalic. Oropharynx and nasopharynx clear.  NECK:  Supple, no jugular venous distention. No thyroid enlargement, no tenderness.  LUNGS: Normal breath sounds bilaterally, no wheezing,  some crepitation. No use of accessory muscles of respiration.  CARDIOVASCULAR: S1, S2 normal. No murmurs, rubs, or gallops.  ABDOMEN: Soft, nontender, nondistended. Bowel sounds present. No organomegaly or mass.  EXTREMITIES: No pedal edema, cyanosis, or clubbing.  NEUROLOGIC: Cranial nerves II through XII are intact. Muscle strength 5/5 in all extremities. Sensation intact. Gait not checked.  PSYCHIATRIC: The patient is alert and oriented x 3.  SKIN: No obvious rash, lesion, or ulcer.   Physical Exam LABORATORY PANEL:   CBC  Recent Labs Lab 10/08/16 0406  WBC 18.7*  HGB 13.6  HCT 41.1  PLT 337   ------------------------------------------------------------------------------------------------------------------  Chemistries   Recent Labs Lab 10/08/16 0406  NA 131*  K 3.3*  CL 100*  CO2 17*  GLUCOSE 258*  BUN 10  CREATININE 0.51  CALCIUM 8.5*  MG 1.6*   ------------------------------------------------------------------------------------------------------------------  Cardiac Enzymes No results for input(s): TROPONINI in the last 168 hours. ------------------------------------------------------------------------------------------------------------------  RADIOLOGY:  Dg Chest 2 View  Result Date: 10/07/2016 CLINICAL DATA:  Cough and fever for 3 days. EXAM: CHEST  2 VIEW COMPARISON:  None. FINDINGS: There is infiltrate in the right middle and lower lobes. The left lung is clear. Mild cardiomegaly. The hila and mediastinum are unremarkable. No pneumothorax. IMPRESSION: Infiltrate in the right middle and lower lobes most consistent with pneumonia. Recommend follow-up to resolution. Electronically Signed   By: Gerome Sam III M.D   On: 10/07/2016 16:53   US Renal  Result Date: 10/08/2016 CLINICAL DATA:  Pregnancy. Bladder neck outlet or urethral obstruction. EXAM: RENAL / URINARY TRACT ULTRASOUND COMPLETE COMPARISON:  No recent prior. FINDINGS: Right  Kidney: Length: 10.7  cm. Echogenicity within normal limits. Trace amount of free fluid about the right kidney. No mass or hydronephrosis visualized. Left Kidney: Length: 11.7 cm. Echogenicity within normal limits. No mass or hydronephrosis visualized. Bladder: Appears normal for degree of bladder distention. IMPRESSION: Trace amount of free fluid about the right kidney. Exam is otherwise unremarkable. No bladder distention or hydronephrosis. Electronically Signed   By: Maisie Fushomas  Register   On: 10/08/2016 15:13    ASSESSMENT AND PLAN:   Principal Problem:   Community acquired pneumonia Active Problems:   Hyponatremia   Type 2 diabetes mellitus with complication, without long-term current use of insulin (HCC)   Pneumonia  * Pneumonia   Levofloxacin.   Patient feels better.   Influenza test negative.  * Hyponatremia   Secondary to infection.   TSH is normal.  * Spinal stenosis   ER physician discussed with neurosurgery, suggested to have outpatient follow-up, patient is taking oral steroid chronically, continue.  * Diabetes mellitus   Antidiabetic diet, sliding scale insulin.  * Complain of urinary obstruction with some prolapse   I will get renal ultrasound and nauseous to examine her after her urination, and to update me about if any polyps observed or not.   He found then we may to call GYN consult.  All the records are reviewed and case discussed with Care Management/Social Workerr. Management plans discussed with the patient, family and they are in agreement.  CODE STATUS: full.  TOTAL TIME TAKING CARE OF THIS PATIENT: 35 minutes.    POSSIBLE D/C IN 1-2 DAYS, DEPENDING ON CLINICAL CONDITION.   Altamese DillingVACHHANI, Curt Oatis M.D on 10/08/2016   Between 7am to 6pm - Pager - [phone]  After 6pm go to [email or web address] - password [email or web address] [email or web address]  [facility] [place] [facility]  Office  [phone]  CC: Primary care physician; Resa MinerRONK, CHRISTINA O, MD  Note: This dictation was prepared with Dragon dictation  along with smaller phrase technology. Any transcriptional errors that result from this process are unintentional.

## 2016-10-08 NOTE — Progress Notes (Signed)
Pt c/o coughing, paged oncall Dr Tobi BastosPyreddy and ordered for Robitussin 10ml every 6hrs PRN. Will administer as ordered.

## 2016-10-08 NOTE — Progress Notes (Signed)
Inpatient Diabetes Program Recommendations  AACE/ADA: New Consensus Statement on Inpatient Glycemic Control (2015)  Target Ranges:  Prepandial:   less than 140 mg/dL      Peak postprandial:   less than 180 mg/dL (1-2 hours)      Critically ill patients:  140 - 180 mg/dL   Review of Glycemic Control  Diabetes history: DM 2 diagnosed 2016 Outpatient Diabetes medications: none Current orders for Inpatient glycemic control: Novolog Moderate (0-15 units) TID + Novolog HS scale (0-5 units)  Inpatient Diabetes Program Recommendations:    Patient met with Dr. Graceann Congress after DM diagnosis in 2016 and was placed on Metformin and Glipizide at that time.  Please consider obtaining an A1c to determine glucose control over the past 2-3 months. Note patient on steroids prior to admission and expect A1c to be slightly elevated. Patient may need to be placed back on oral medications at time of d/c depending on level of A1c. Patient will need to monitor glucose levels and follow up with MD outpatient for further DM management.  Thanks,  Tama Headings RN, MSN, [facility] Inpatient Diabetes Coordinator Team Pager [phone] (8a-5p)

## 2016-10-09 ENCOUNTER — Encounter: Payer: Self-pay | Admitting: General Practice

## 2016-10-09 LAB — CBC
HCT: 34.9 % — ABNORMAL LOW (ref 35.0–47.0)
HEMOGLOBIN: 12 g/dL (ref 12.0–16.0)
MCH: 28.7 pg (ref 26.0–34.0)
MCHC: 34.3 g/dL (ref 32.0–36.0)
MCV: 83.8 fL (ref 80.0–100.0)
PLATELETS: 364 10*3/uL (ref 150–440)
RBC: 4.17 MIL/uL (ref 3.80–5.20)
RDW: 13.8 % (ref 11.5–14.5)
WBC: 17.1 10*3/uL — AB (ref 3.6–11.0)

## 2016-10-09 LAB — GLUCOSE, CAPILLARY
GLUCOSE-CAPILLARY: 323 mg/dL — AB (ref 65–99)
GLUCOSE-CAPILLARY: 247 mg/dL — AB (ref 65–99)
GLUCOSE-CAPILLARY: 253 mg/dL — AB (ref 65–99)
GLUCOSE-CAPILLARY: 283 mg/dL — AB (ref 65–99)

## 2016-10-09 MED ORDER — POTASSIUM CHLORIDE CRYS ER 20 MEQ PO TBCR
40.0000 meq | EXTENDED_RELEASE_TABLET | Freq: Two times a day (BID) | ORAL | Status: AC
  Administered 2016-10-09 (×2): 40 meq via ORAL
  Filled 2016-10-09 (×2): qty 2

## 2016-10-09 MED ORDER — INSULIN GLARGINE 100 UNIT/ML ~~LOC~~ SOLN
10.0000 [IU] | Freq: Every day | SUBCUTANEOUS | Status: DC
  Administered 2016-10-09: 10 [IU] via SUBCUTANEOUS
  Filled 2016-10-09 (×2): qty 0.1

## 2016-10-09 NOTE — Evaluation (Signed)
Physical Therapy Evaluation Patient Details Name: Hayley AcostaMaria C Pankowski MRN: [id number] DOB: 1945-11-30 Today's Date: 10/09/2016   History of Present Illness  71 y/o Spanish-speaking female here with c/o recently increased weakness and severe low back pain.  Previous MRIs show spinal stenosis.  She reports some numbness in b/l LE (roughly equal b/l) and recent onset incontinence, but does have good strength and quality of motion with all LE movements.    Clinical Impression  PT eval via interpreter.  Pt reported no pain at rest in bed and was able to get to sitting w/o excessive UE use and maintained sitting balance w/o increased pain.  She was clearly guarded and having pain with ambulation (using cane) but overall did well and was safe for in home ambulation and not too far from her baseline.  She reports numbness in b/l LE, but had good strength, coordination and quality of movement in legs.  Pt with no buckling, LOBs or signficant safety concerns with the effort.  Discussed that HHPT would likely not greatly change her pain that they could do exercises for strength and mobility - pt agrees that she would not likely get much benefit given her chronic back pain and deferred PT.  She would, however, benefit from a youth height walker.     Follow Up Recommendations No PT follow up    Equipment Recommendations  Rolling walker with 5" wheels (youth height)    Recommendations for Other Services       Precautions / Restrictions Precautions Precautions: Fall Restrictions Weight Bearing Restrictions: No      Mobility  Bed Mobility Overal bed mobility: Independent             General bed mobility comments: Pt able to rise to sitting and maintain position w/o issue, w/o c/o pain  Transfers Overall transfer level: Independent Equipment used: Rolling walker (2 wheeled)             General transfer comment: Pt able to rise to standing with good confidence and did not need  assist  Ambulation/Gait Ambulation/Gait assistance: Modified independent (Device/Increase time) Ambulation Distance (Feet): 35 Feet Assistive device: Straight cane       General Gait Details: Pt with guarded but safe gait.  She uses cane at baseline and did so today, though she was reaching for counter top, etc some of the time.  She had slow but consistent cadence, no buckling and no reports of excessive increased pain.  We did discuss that using a walker could take some pressure off low back and make ambulation easier/safer.   Stairs            Wheelchair Mobility    Modified Rankin (Stroke Patients Only)       Balance Overall balance assessment: Modified Independent (good sitting balance, no LOBs with standing)                                           Pertinent Vitals/Pain Pain Assessment:  (no pain at rest, 7/10 in low back during ambulation)    Home Living Family/patient expects to be discharged to:: Private residence Living Arrangements: Children;Other relatives Available Help at Discharge: Family;Available 24 hours/day   Home Access: Stairs to enter Entrance Stairs-Rails: Right;Left Entrance Stairs-Number of Steps: 3   Home Equipment: Cane - single point      Prior Function Level of Independence: Independent with  assistive device(s)         Comments: Apparently she does not get out of the house unless her family "forces" her but with family around will walk and be active in the home.      Hand Dominance        Extremity/Trunk Assessment   Upper Extremity Assessment Upper Extremity Assessment: Overall WFL for tasks assessed;Generalized weakness    Lower Extremity Assessment Lower Extremity Assessment: Overall WFL for tasks assessed       Communication   Communication: Interpreter utilized Orson Slick)  Cognition Arousal/Alertness: Awake/alert Behavior During Therapy: WFL for tasks assessed/performed Overall Cognitive  Status: Within Functional Limits for tasks assessed                      General Comments      Exercises     Assessment/Plan    PT Assessment Patent does not need any further PT services (encouraged pt to stay active at home)  PT Problem List         PT Treatment Interventions      PT Goals (Current goals can be found in the Care Plan section)  Acute Rehab PT Goals Patient Stated Goal: control the pain PT Goal Formulation: With patient Time For Goal Achievement: 10/23/16 Potential to Achieve Goals: Fair    Frequency     Barriers to discharge        Co-evaluation               End of Session Equipment Utilized During Treatment: Gait belt Activity Tolerance: Patient limited by pain;Patient tolerated treatment well Patient left: with chair alarm set;with call bell/phone within reach;with family/visitor present   PT Visit Diagnosis: Pain;Difficulty in walking, not elsewhere classified (R26.2) Pain - Right/Left:  (b/l) Pain - part of body:  (back)         Time: 1610-9604 PT Time Calculation (min) (ACUTE ONLY): 24 min   Charges:   PT Evaluation $PT Eval Low Complexity: 1 Procedure     PT G CodesMalachi Pro, DPT 10/09/2016, 1:46 PM

## 2016-10-09 NOTE — Progress Notes (Signed)
Inpatient Diabetes Program Recommendations  AACE/ADA: New Consensus Statement on Inpatient Glycemic Control (2015)  Target Ranges:  Prepandial:   less than 140 mg/dL      Peak postprandial:   less than 180 mg/dL (1-2 hours)      Critically ill patients:  140 - 180 mg/dL   Lab Results  Component Value Date   GLUCAP 247 (H) 10/09/2016   HGBA1C 7.1 (H) 07/07/2014    Review of Glycemic Control  Results for Hayley Mitchell, Hayley Mitchell (MRN [id number]) as of 10/09/2016 15:09  Ref. Range 10/08/2016 11:39 10/08/2016 16:06 10/08/2016 21:05 10/09/2016 07:49 10/09/2016 11:19  Glucose-Capillary Latest Ref Range: 65 - 99 mg/dL 337 (H) 222 (H) 242 (H) 323 (H) 247 (H)     Diabetes history: DM 2 diagnosed 2016 Outpatient Diabetes medications: none Current orders for Inpatient glycemic control: Novolog Moderate (0-15 units) TID + Novolog HS scale (0-5 units), Lantus 10 units qhs  Inpatient Diabetes Program Recommendations:    Patient met with Dr. Graceann Congress after DM diagnosis in 2016 and was placed on Metformin and Glipizide at that time.  Please consider obtaining an A1c to determine glucose control over the past 2-3 months. Note patient on steroids prior to admission and expect A1c to be slightly elevated. Patient may need to be placed back on oral medications at time of d/c depending on level of A1c. Patient will need to monitor glucose levels and follow up with MD outpatient for further DM management.  Agree with Lantus 10 units beginning tonight.   Gentry Fitz, RN, BA, MHA, CDE Diabetes Coordinator Inpatient Diabetes Program  [phone] (Team Pager) [phone] ([place]) 10/09/2016 3:11 PM

## 2016-10-09 NOTE — Consult Note (Signed)
Consulted by Dr. Elisabeth PigeonVachhani regarding patient having complaints of "a bulge" in vagina when going to the restroom.  This is apparently an ongoing problem that has been noted by the patient for over 1 year.  Phone convernsation had with Dr. Elisabeth PigeonVachhani that this is something that is usually seen in the outpatient setting unless she is having severe difficulty urinating or has retention due to obstruction or protrusion from prolapsed organ.  Will have patient f/u in clinic once discharged to address pelvic organ prolapse.   Please feel free to consult if anything changes during patient's hospital stay.    Hildred LaserAnika Gertrue Willette, MD  Obstetrics and Gynecology [facility] Phone: [phone]) [phone]

## 2016-10-09 NOTE — Progress Notes (Signed)
[facility] - [place] at [facility]   PATIENT NAME: Hayley GreenMaria Bjorkman    MR#:  [id number]  DATE OF BIRTH:  10-10-1945  SUBJECTIVE:  CHIEF COMPLAINT:   Chief Complaint  Patient presents with  . Generalized Body Aches  . Cough  . Fever  . Hyperglycemia    Came with fever and Cough for last few days, found to have pneumonia on chest x-ray and with antibiotic for one day she feels better today.  She has chronic complain of lower back pain and going with urinary incontinence and requiring oral steroid every day to help with that.  She have c/o feeling" something coming down in her vagina" while urinating and feel obstructions during that, but able to void, and no pain.  REVIEW OF SYSTEMS:  CONSTITUTIONAL: No fever, fatigue or weakness.  EYES: No blurred or double vision.  EARS, NOSE, AND THROAT: No tinnitus or ear pain.  RESPIRATORY: positive for cough, shortness of breath, wheezing or hemoptysis.  CARDIOVASCULAR: No chest pain, orthopnea, edema.  GASTROINTESTINAL: No nausea, vomiting, diarrhea or abdominal pain.  GENITOURINARY: No dysuria, hematuria. c/o something coming out from her vagina everytime she urinates. ENDOCRINE: No polyuria, nocturia,  HEMATOLOGY: No anemia, easy bruising or bleeding SKIN: No rash or lesion. MUSCULOSKELETAL: No joint pain or arthritis.  Low back pain NEUROLOGIC: No tingling, numbness, weakness.  PSYCHIATRY: No anxiety or depression.   ROS  DRUG ALLERGIES:  No Known Allergies  VITALS:  Blood pressure (!) 116/59, pulse 88, temperature 99.5 F (37.5 C), temperature source Oral, resp. rate (!) 22, height 5\' 2"  (1.575 m), weight 82 kg (180 lb 11.2 oz), SpO2 96 %.  PHYSICAL EXAMINATION:  GENERAL:  71 y.o.-year-old patient lying in the bed with no acute distress.  EYES: Pupils equal, round, reactive to light and accommodation. No scleral icterus. Extraocular muscles intact.  HEENT: Head atraumatic, normocephalic. Oropharynx and  nasopharynx clear.  NECK:  Supple, no jugular venous distention. No thyroid enlargement, no tenderness.  LUNGS: Normal breath sounds bilaterally, no wheezing, some crepitation. No use of accessory muscles of respiration.  CARDIOVASCULAR: S1, S2 normal. No murmurs, rubs, or gallops.  ABDOMEN: Soft, nontender, nondistended. Bowel sounds present. No organomegaly or mass.  EXTREMITIES: No pedal edema, cyanosis, or clubbing.  NEUROLOGIC: Cranial nerves II through XII are intact. Muscle strength 5/5 in all extremities. Sensation intact. Gait not checked.  PSYCHIATRIC: The patient is alert and oriented x 3.  SKIN: No obvious rash, lesion, or ulcer.   Physical Exam LABORATORY PANEL:   CBC  Recent Labs Lab 10/09/16 1419  WBC 17.1*  HGB 12.0  HCT 34.9*  PLT 364   ------------------------------------------------------------------------------------------------------------------  Chemistries   Recent Labs Lab 10/08/16 0406  NA 131*  K 3.3*  CL 100*  CO2 17*  GLUCOSE 258*  BUN 10  CREATININE 0.51  CALCIUM 8.5*  MG 1.6*   ------------------------------------------------------------------------------------------------------------------  Cardiac Enzymes No results for input(s): TROPONINI in the last 168 hours. ------------------------------------------------------------------------------------------------------------------  RADIOLOGY:  [place] Renal  Result Date: 10/08/2016 CLINICAL DATA:  Pregnancy. Bladder neck outlet or urethral obstruction. EXAM: RENAL / URINARY TRACT ULTRASOUND COMPLETE COMPARISON:  No recent prior. FINDINGS: Right Kidney: Length: 10.7 cm. Echogenicity within normal limits. Trace amount of free fluid about the right kidney. No mass or hydronephrosis visualized. Left Kidney: Length: 11.7 cm. Echogenicity within normal limits. No mass or hydronephrosis visualized. Bladder: Appears normal for degree of bladder distention. IMPRESSION: Trace amount of free fluid about the  right kidney. Exam is otherwise  unremarkable. No bladder distention or hydronephrosis. Electronically Signed   By: Maisie Fus  Register   On: 10/08/2016 15:13    ASSESSMENT AND PLAN:   Principal Problem:   Community acquired pneumonia Active Problems:   Hyponatremia   Type 2 diabetes mellitus with complication, without long-term current use of insulin (HCC)   Pneumonia  * Pneumonia   Levofloxacin.   Patient feels better.   Influenza test negative.  * Hyponatremia   Secondary to infection.   TSH is normal.  * Spinal stenosis   ER physician discussed with neurosurgery, suggested to have outpatient follow-up, patient is taking oral steroid chronically, continue.  * Diabetes mellitus   Antidiabetic diet, sliding scale insulin.  * Complain of urinary obstruction with some prolapse   She is able to urinate and this is chronic ongoing complain.   As per nurse, there was no prolapse- when she examined after urination.  I spoke to GYn doctor- as per them, if there is no emergency- this can be managed in office, advised to come to clinic.  All the records are reviewed and case discussed with Care Management/Social Workerr. Management plans discussed with the patient, family and they are in agreement.  CODE STATUS: full.  TOTAL TIME TAKING CARE OF THIS PATIENT: 35 minutes.    POSSIBLE D/C IN 1-2 DAYS, DEPENDING ON CLINICAL CONDITION.   Altamese Dilling M.D on 10/09/2016   Between 7am to 6pm - Pager - [phone]  After 6pm go to [email or web address] - password [employer]  [facility]  Office  [phone]  CC: Primary care physician; Resa Miner, MD  Note: This dictation was prepared with Dragon dictation along with smaller phrase technology. Any transcriptional errors that result from this process are unintentional.

## 2016-10-10 LAB — CBC
HEMATOCRIT: 36.1 % (ref 35.0–47.0)
HEMOGLOBIN: 12.2 g/dL (ref 12.0–16.0)
MCH: 28.4 pg (ref 26.0–34.0)
MCHC: 33.8 g/dL (ref 32.0–36.0)
MCV: 84.1 fL (ref 80.0–100.0)
Platelets: 391 10*3/uL (ref 150–440)
RBC: 4.29 MIL/uL (ref 3.80–5.20)
RDW: 13.8 % (ref 11.5–14.5)
WBC: 15 10*3/uL — ABNORMAL HIGH (ref 3.6–11.0)

## 2016-10-10 LAB — BASIC METABOLIC PANEL
Anion gap: 9 (ref 5–15)
BUN: 8 mg/dL (ref 6–20)
CHLORIDE: 102 mmol/L (ref 101–111)
CO2: 22 mmol/L (ref 22–32)
Calcium: 8.2 mg/dL — ABNORMAL LOW (ref 8.9–10.3)
Creatinine, Ser: 0.41 mg/dL — ABNORMAL LOW (ref 0.44–1.00)
GFR calc Af Amer: >60 mL/min (ref >60)
GFR calc non Af Amer: >60 mL/min (ref >60)
GLUCOSE: 251 mg/dL — AB (ref 65–99)
POTASSIUM: 3.5 mmol/L (ref 3.5–5.1)
Sodium: 133 mmol/L — ABNORMAL LOW (ref 135–145)

## 2016-10-10 LAB — GLUCOSE, CAPILLARY
Glucose-Capillary: 233 mg/dL — ABNORMAL HIGH (ref 65–99)
Glucose-Capillary: 194 mg/dL — ABNORMAL HIGH (ref 65–99)

## 2016-10-10 LAB — MAGNESIUM: Magnesium: 1.9 mg/dL (ref 1.7–2.4)

## 2016-10-10 MED ORDER — INSULIN GLARGINE 100 UNIT/ML ~~LOC~~ SOLN
15.0000 [IU] | Freq: Every day | SUBCUTANEOUS | Status: DC
  Administered 2016-10-10: 15 [IU] via SUBCUTANEOUS
  Filled 2016-10-10 (×2): qty 0.15

## 2016-10-10 MED ORDER — LEVOFLOXACIN 750 MG PO TABS: 750.0000 mg | ORAL_TABLET | Freq: Every day | ORAL | 0 refills | Status: AC

## 2016-10-10 MED ORDER — GUAIFENESIN-DM 100-10 MG/5ML PO SYRP: 5.0000 mL | ORAL_SOLUTION | ORAL | Status: DC | PRN

## 2016-10-10 MED ORDER — BENZONATATE 100 MG PO CAPS: 100.0000 mg | ORAL_CAPSULE | Freq: Three times a day (TID) | ORAL | 0 refills | Status: AC | PRN

## 2016-10-10 MED ORDER — METFORMIN HCL 500 MG PO TABS: 500.0000 mg | ORAL_TABLET | Freq: Two times a day (BID) | ORAL | 1 refills | Status: AC

## 2016-10-10 MED ORDER — METFORMIN HCL 500 MG PO TABS
500.0000 mg | ORAL_TABLET | Freq: Two times a day (BID) | ORAL | Status: DC
  Administered 2016-10-10: 500 mg via ORAL
  Filled 2016-10-10: qty 1

## 2016-10-10 MED ORDER — GLIPIZIDE ER 10 MG PO TB24
10.0000 mg | ORAL_TABLET | Freq: Every day | ORAL | 1 refills | Status: AC
Start: 2016-10-10 — End: ?

## 2016-10-10 MED ORDER — GLIPIZIDE ER 5 MG PO TB24
10.0000 mg | ORAL_TABLET | Freq: Every day | ORAL | Status: DC
  Administered 2016-10-10: 10 mg via ORAL
  Filled 2016-10-10: qty 2

## 2016-10-10 MED ORDER — BENZONATATE 100 MG PO CAPS
100.0000 mg | ORAL_CAPSULE | Freq: Three times a day (TID) | ORAL | Status: DC | PRN
Start: 2016-10-10 — End: 2016-10-10
  Administered 2016-10-10: 100 mg via ORAL
  Filled 2016-10-10: qty 1

## 2016-10-10 MED ORDER — LEVOFLOXACIN 750 MG PO TABS
750.0000 mg | ORAL_TABLET | Freq: Every day | ORAL | Status: DC
  Administered 2016-10-10: 750 mg via ORAL
  Filled 2016-10-10: qty 1

## 2016-10-10 MED ORDER — INSULIN GLARGINE 100 UNIT/ML ~~LOC~~ SOLN
15.0000 [IU] | Freq: Every day | SUBCUTANEOUS | 11 refills | Status: AC
Start: 2016-10-10 — End: ?

## 2016-10-10 NOTE — Progress Notes (Signed)
Pt c/o abd pain this morning. PRN pain medication given. Abd soft, active bowel sounds. Nursing continues to monitor.

## 2016-10-10 NOTE — Discharge Summary (Signed)
[facility] - [place] at [facility]   PATIENT NAME: Hayley Mitchell    MR#:  [id number]  DATE OF BIRTH:  1946/03/27  DATE OF ADMISSION:  10/07/2016 ADMITTING PHYSICIAN: Gery Pray, MD  DATE OF DISCHARGE: 10/10/16  PRIMARY CARE PHYSICIAN: Resa Miner, MD    ADMISSION DIAGNOSIS:  Cough [R05] Community acquired pneumonia of right lung, unspecified part of lung [J18.9]  DISCHARGE DIAGNOSIS:  Pneumonia  SECONDARY DIAGNOSIS:   Past Medical History:  Diagnosis Date  . Allergy    pt denies any significant medical history to herself or family, no meds.  . Chronic back pain   . Diabetes mellitus without complication [facility])     HOSPITAL COURSE:  71 year old  spanish speaking only female who has had a fever for the past 2 days. She is a nonproductive cough, generalized body aches. She is being for a week and now unable to stand. She denies any nausea or vomiting. She denies any wheezing or significant shortness of breath  Pneumonia right ML  cont po Levofloxacin.   Patient feels better.   Influenza test negative.  * Hyponatremia   Secondary to infection.   TSH is normal.  * Spinal stenosis   ER physician discussed with neurosurgery, suggested to have outpatient follow-up, patient is taking oral steroid chronically, continue.  * Diabetes mellitus-uncontrolled due to steroids and PNA -po metformin, glipizide and lantus (home meds)   Antidiabetic diet, sliding scale insulin.  * Complain of urinary obstruction with some prolapse   this is chronic ongoing complain.   As per nurse, there was no prolapse- when she examined after urination.  pt will f/u dr Valentino Saxon as out pt  Spoke with pt via interpreter. D/c home  CONSULTS OBTAINED:  Treatment Team:  Hildred Laser, MD  DRUG ALLERGIES:  No Known Allergies  DISCHARGE MEDICATIONS:   Current Discharge Medication List    START taking these medications   Details  benzonatate (TESSALON)  100 MG capsule Take 1 capsule (100 mg total) by mouth 3 (three) times daily as needed for cough. Qty: 20 capsule, Refills: 0    glipiZIDE (GLUCOTROL XL) 10 MG 24 hr tablet Take 1 tablet (10 mg total) by mouth daily with breakfast. Qty: 30 tablet, Refills: 1    insulin glargine (LANTUS) 100 UNIT/ML injection Inject 0.15 mLs (15 Units total) into the skin daily. Qty: 10 mL, Refills: 11    levofloxacin (LEVAQUIN) 750 MG tablet Take 1 tablet (750 mg total) by mouth daily. Qty: 4 tablet, Refills: 0    metFORMIN (GLUCOPHAGE) 500 MG tablet Take 1 tablet (500 mg total) by mouth 2 (two) times daily with a meal. Qty: 60 tablet, Refills: 1      CONTINUE these medications which have NOT CHANGED   Details  !! predniSONE (STERAPRED UNI-PAK 21 TAB) 10 MG (21) TBPK tablet As instructed on packaging. Qty: 21 tablet, Refills: 0    PRESCRIPTION MEDICATION Inject 1 Dose into the skin. Insulin    traMADol (ULTRAM) 50 MG tablet Take 1 tablet (50 mg total) by mouth every 6 (six) hours as needed. Qty: 15 tablet, Refills: 0    !! predniSONE (STERAPRED UNI-PAK 21 TAB) 10 MG (21) TBPK tablet Take 1 tablet (10 mg total) by mouth daily. Qty: 21 tablet, Refills: 0     !! - Potential duplicate medications found. Please discuss with provider.      If you experience worsening of your admission symptoms, develop shortness of breath, life threatening emergency,  suicidal or homicidal thoughts you must seek medical attention immediately by calling 911 or calling your MD immediately  if symptoms less severe.  You Must read complete instructions/literature along with all the possible adverse reactions/side effects for all the Medicines you take and that have been prescribed to you. Take any new Medicines after you have completely understood and accept all the possible adverse reactions/side effects.   Please note  You were cared for by a hospitalist during your hospital stay. If you have any questions about your  discharge medications or the care you received while you were in the hospital after you are discharged, you can call the unit and asked to speak with the hospitalist on call if the hospitalist that took care of you is not available. Once you are discharged, your primary care physician will handle any further medical issues. Please note that NO REFILLS for any discharge medications will be authorized once you are discharged, as it is imperative that you return to your primary care physician (or establish a relationship with a primary care physician if you do not have one) for your aftercare needs so that they can reassess your need for medications and monitor your lab values. Today   SUBJECTIVE   Doing well  VITAL SIGNS:  Blood pressure 125/63, pulse 84, temperature 98.1 F (36.7 C), temperature source Oral, resp. rate 18, height 5\' 2"  (1.575 m), weight 82 kg (180 lb 11.2 oz), SpO2 94 %.  I/O:   Intake/Output Summary (Last 24 hours) at 10/10/16 0847 Last data filed at 10/09/16 1343  Gross per 24 hour  Intake              720 ml  Output                0 ml  Net              720 ml    PHYSICAL EXAMINATION:  GENERAL:  71 y.o.-year-old patient lying in the bed with no acute distress. obese EYES: Pupils equal, round, reactive to light and accommodation. No scleral icterus. Extraocular muscles intact.  HEENT: Head atraumatic, normocephalic. Oropharynx and nasopharynx clear.  NECK:  Supple, no jugular venous distention. No thyroid enlargement, no tenderness.  LUNGS: Normal breath sounds bilaterally, no wheezing, rales,rhonchi or crepitation. No use of accessory muscles of respiration.  CARDIOVASCULAR: S1, S2 normal. No murmurs, rubs, or gallops.  ABDOMEN: Soft, non-tender, non-distended. Bowel sounds present. No organomegaly or mass.  EXTREMITIES: No pedal edema, cyanosis, or clubbing.  NEUROLOGIC: Cranial nerves II through XII are intact. Muscle strength 5/5 in all extremities. Sensation  intact. Gait not checked.  PSYCHIATRIC: The patient is alert and oriented x 3.  SKIN: No obvious rash, lesion, or ulcer.   DATA REVIEW:   CBC   Recent Labs Lab 10/10/16 0521  WBC 15.0*  HGB 12.2  HCT 36.1  PLT 391    Chemistries   Recent Labs Lab 10/10/16 0520  NA 133*  K 3.5  CL 102  CO2 22  GLUCOSE 251*  BUN 8  CREATININE 0.41*  CALCIUM 8.2*  MG 1.9    Microbiology Results   Recent Results (from the past 240 hour(s))  Blood culture (routine x 2)     Status: None (Preliminary result)   Collection Time: 10/07/16  5:40 PM  Result Value Ref Range Status   Specimen Description BLOOD RIGHT ASSIST CONTROL  Final   Special Requests BOTTLES DRAWN AEROBIC AND ANAEROBIC BCAV  Final  Culture NO GROWTH 3 DAYS  Final   Report Status PENDING  Incomplete  Blood culture (routine x 2)     Status: None (Preliminary result)   Collection Time: 10/07/16  5:40 PM  Result Value Ref Range Status   Specimen Description BLOOD RIGHT ARM  Final   Special Requests BOTTLES DRAWN AEROBIC AND ANAEROBIC BCHV  Final   Culture NO GROWTH 3 DAYS  Final   Report Status PENDING  Incomplete    RADIOLOGY:  K[place]as Renal  Result Date: 10/08/2016 CLINICAL DATA:  Pregnancy. Bladder neck outlet or urethral obstruction. EXAM: RENAL / URINARY TRACT ULTRASOUND COMPLETE COMPARISON:  No recent prior. FINDINGS: Right Kidney: Length: 10.7 cm. Echogenicity within normal limits. Trace amount of free fluid about the right kidney. No mass or hydronephrosis visualized. Left Kidney: Length: 11.7 cm. Echogenicity within normal limits. No mass or hydronephrosis visualized. Bladder: Appears normal for degree of bladder distention. IMPRESSION: Trace amount of free fluid about the right kidney. Exam is otherwise unremarkable. No bladder distention or hydronephrosis. Electronically Signed   By: Maisie Fushomas  Register   On: 10/08/2016 15:13     Management plans discussed with the patient, family and they are in agreement.  CODE  STATUS:     Code Status Orders        Start     Ordered   10/07/16 2032  Full code  Continuous     10/07/16 2031    Code Status History    Date Active Date Inactive Code Status Order ID Comments User Context   This patient has a current code status but no historical code status.      TOTAL TIME TAKING CARE OF THIS PATIENT: 40 minutes.    Mirko Tailor M.D on 10/10/2016 at 8:47 AM  Between 7am to 6pm - Pager - [phone] After 6pm go to [email or web address] - Social research officer, governmentpassword [email or web address]  [facility]  Office  [phone]  CC: Primary care physician; Resa MinerRONK, CHRISTINA O, MD

## 2016-10-10 NOTE — Care Management Important Message (Signed)
Important Message  Patient Details  Name: Hayley Mitchell MRN: [id number] Date of Birth: 1946/05/08   Medicare Important Message Given:  Yes    Marily MemosLisa M Neviah Braud, RN 10/10/2016, 2:19 PM

## 2016-10-10 NOTE — Discharge Instructions (Signed)
Check your sugars as before and take insulin as before with your oral diab meds

## 2016-10-11 LAB — HEMOGLOBIN A1C: Hgb A1c MFr Bld: >15.5 % — ABNORMAL HIGH (ref 4.8–5.6)

## 2016-10-12 LAB — CULTURE, BLOOD (ROUTINE X 2)
CULTURE: NO GROWTH
Culture: NO GROWTH

## 2017-09-25 ENCOUNTER — Encounter: Payer: Self-pay | Admitting: General Surgery

## 2017-09-25 ENCOUNTER — Ambulatory Visit: Payer: Medicare Other | Attending: Oncology

## 2018-03-27 ENCOUNTER — Encounter: Payer: Self-pay | Admitting: Rehabilitative and Restorative Service Providers"

## 2018-03-27 ENCOUNTER — Inpatient Hospital Stay: Payer: Medicare Other | Attending: Physician Assistant | Admitting: Rehabilitative and Restorative Service Providers"

## 2018-03-27 DIAGNOSIS — M542 Cervicalgia: Secondary | ICD-10-CM | POA: Insufficient documentation

## 2018-03-27 DIAGNOSIS — M25561 Pain in right knee: Secondary | ICD-10-CM | POA: Insufficient documentation

## 2018-03-27 DIAGNOSIS — M25512 Pain in left shoulder: Secondary | ICD-10-CM | POA: Insufficient documentation

## 2018-03-27 DIAGNOSIS — M25562 Pain in left knee: Secondary | ICD-10-CM | POA: Insufficient documentation

## 2018-03-27 NOTE — Progress Notes (Signed)
Name:Pamela Booth Age: 72 y.o.   Today's Date: 03/31/2018  Referring Physician:     Date of Injury: 01/20/2018  Date Care Plan Established/Reviewed: 03/27/2018  Date Treatment Started: 03/27/2018  End of Certification Date: 06/24/2018  Sessions in Plan of Care: 12  Surgery Date: No data was found      Visit Count: 1   Diagnosis:   1. Neck pain    2. Pain in shoulder region, left    3. Anterior knee pain, left    4. Anterior knee pain, right        Subjective     History of Present Illness   History of Present Illness: Hx of neck pain - since 2011 - treated with therapy - last time 8 years ago . Pt relays her pain  has gotten worse. Saw PCP - XR showed DJD and arthritidis - referred to PT    L shoulder pain started 7 years ago  - treated with medicine- XR- showed OA- referred to PT    B Knee pain     Functional Limitations (PLOF): U/a to turn head to side to side ; u/a to look up ; u/a to read > 30 minutes ( 1 h)   difficulty reaching above her head , behind her back and across her body. Pain lifting > 5 lb - Pain sleeping on Left side     Pain   Current pain rating: 5  At worst pain rating: 6  Neck pain- constant - pain referred to B UT and Upper thoracic spine   L shoulder pain - intermittent - Superior aspect -  Pain 3/10- worst 6/10     Social Support/Occupation      Occupation: retired       Precautions: hypertension  diabetes  Patient has no known allergies.  Past Medical History:   Diagnosis Date   . Diabetes mellitus    . Hypertension    . Osteoarthritis        Objective     Posture     Head  Forward.    Shoulders  Rounded.    Thoracic Spine  Hyperkyphosis.    Tests   Cervical     Left (Cervical)   Negative Spurling's sign and cervical distraction.     Right (Cervical)   Negative Spurling's sign and cervical distraction.     Thoracic   Negative slump.     Left Shoulder   Negative compression test.     Right Shoulder   Negative compression test.   Lumbar/Pelvic Girdle/Sacrum   Negative slump.         AROM: Cervical  Spine   Initial      Flexion 45      Extension 30 p       R L R L   Rotation 50 p 30 p     Side Bending 20 p 10 p     (blank fields were intentionally left blank)          InitialRight  AROM InitialRight  PROM   Right  AROM   Right PROM Shoulder InitialLeft AROM InitialLeft PROM   Left AROM   Left PROM   150 155   Flexion 150 155         Extension       150 155   Abduction 150 155     90 90   Internal Rotation 90 90     90 90   External Rotation 90  90                (blank fields were intentionally left blank)      Initial R   R UE Strength  MMT /5 Initial  L   L   4+  Shoulder Flexion 4+    4  Shoulder Abduction (C5) 4    4+  Shoulder Extension 4+    4  Shoulder External Rotation 4    4  Shoulder Internal Rotation 4    3+  Rhomboids 3+      Serratus       Upper Trapezius (C4)     3+  Middle Trapezius (C4) 3+    3  Lower Trapezius (C4) 3    5  Biceps (C6) 5    5  Triceps (C7) 5    5  Wrist Extension (C7) 5    5  Wrist Flexion (C7) 5    (blank fields were intentionally left blank)      Treatment     Therapeutic Exercises    Access Code: [email or web address]   URL: [email or web address]   Date: 03/27/2018   Prepared by: Drexel Iha      Exercises Seated Scapular Retraction - 15 reps - 1 sets - 5 s hold - 2x daily - 7x weekly   Seated Cervical Rotation AROM - 10 reps - 1 sets - 5 s hold - 2x daily - 7x weekly   Seated Neck Sidebending ROM - 10 reps - 1 sets - 5 s hold - 2x daily - 7x weekly   Seated Cervical Flexion AROM - 10 reps - 1 sets - 2x daily - 7x weekly   Shoulder Flexion at Wall - 10 reps - 1 sets - 1x daily - 7x weekly               ---      ---   Total Time   Timed Minutes  25 minutes   Untimed Minutes  20 minutes   Total Time  45 minutes        Assessment   Harpreet is a 72 y.o. Female who requires Physical Therapy for the following:  Impairments: Decreased and painful Cervical/ B shoulders ROM; Scapular weakness; Poor posture     Pain located: neck  pain     Clinical presentation: evolving : pain progressively getting worse  Prior Level of Function: U/a to turn head to side to side ; u/a to look up ; u/a to read > 30 minutes ( 1 h)   difficulty reaching above her head , behind her back and across her body. Pain lifting > 5 lb - Pain sleeping on Left side   Prognosis: good  Plan   Visits per week: 2  Number of Sessions: 12  Direct One on One  16109: Therapeutic Exercise: To Develop Strength and Endurance, ROM and Flexibility  [id number]: Gait Training  60454: Neuromuscular Reeducation  97140: Manual Therapy techniques (mobilization, manipulation, manual traction)  97530: Therapeutic Activities: Dynamic activities to improve functional performance  97035: Ultrasound  Dry Needling  Supervised Modalities  97010: Thermal modalities: hot/cold packs  09811: Electrical stimulation  Next visit : Eval B knee       Goals    Goal 1:  Patient will demonstrate independence in prescribed HEP with proper form, sets and reps for safe discharge to an independent program.   Sessions:  12   Progression:  new      Goal 2:  Increase active cervical rotation to 65 degrees for increased visual field to allow safe changing lanes and backing up vehicle while driving.   Sessions:  12   Progression:  new       Goal 3:  Increase B shoulder flexion/ ABD AROM to min 165  degrees to allow pt to reach for objects on high shelf/cupboard.   Sessions:  12   Progression:  new      Goal 4:  Increase mid-trap/lower trap strength 4 for maintenance of proper sitting and staning posture    Sessions:  12   Progression:  new                                Drexel Iha, PT

## 2018-04-01 ENCOUNTER — Inpatient Hospital Stay: Payer: Medicare Other | Admitting: Rehabilitative and Restorative Service Providers"

## 2018-04-01 DIAGNOSIS — M25562 Pain in left knee: Secondary | ICD-10-CM

## 2018-04-01 DIAGNOSIS — M25512 Pain in left shoulder: Secondary | ICD-10-CM

## 2018-04-01 DIAGNOSIS — M25561 Pain in right knee: Secondary | ICD-10-CM

## 2018-04-01 DIAGNOSIS — M542 Cervicalgia: Secondary | ICD-10-CM

## 2018-04-01 NOTE — Progress Notes (Addendum)
Name:Pamela Booth Age: 72 y.o.   Date of Service: 04/01/2018  Referring Physician: Heron Nay D, PA   Date of Injury: 01/20/2018  Date Care Plan Established/Reviewed: 03/27/2018  Date Treatment Started: 03/27/2018  End of Certification Date: 06/24/2018  Sessions in Plan of Care: 12  Surgery Date: No data was found      Visit Count: 2   Diagnosis:   1. Neck pain    2. Pain in shoulder region, left    3. Anterior knee pain, left    4. Anterior knee pain, right        Subjective     History of Present Illness   Functional Limitations (PLOF): U/a to turn head to side to side ; u/a to look up ; u/a to read > 30 minutes ( 1 h)   difficulty reaching above her head , behind her back and across her body. Pain lifting > 5 lb - Pain sleeping on Left side     Pain   History of Present Illness: Pt reports long history of B knee pain . Treated with Therapy ( last Tx 3 years ago) and injections ( last one a month ago) . Pt relays that her pain had gotten worse but reports significant improvement  after the last injection. According to the pt XR of her knees showed severe arthritis and she was advised that she is a candidate for knee surgery but pt declined.  Functional Limitations (PLOF): Pt is u/a to walk > 20 minutes ( a year ago she was able to walk up to 60" )  . Difficulty negotiating stairs  reciprocally due to pain.        B knee pain ( R>L) - pain is constant w/o medicine . Localizes her pain on medial and ant aspect of B knee.     R knee current 0/10 - worst 8/10  L knee current 0/10 - worst 8/10     Pt describes herself as sedentary     Social Support/Occupation  Lives in: multiple level home  Lives with: adult children  Occupation: retired       Precautions: hypertension  diabetes  Allergies: Patient has no known allergies.    Past Medical History:   Diagnosis Date   . Diabetes mellitus    . Hypertension    . Osteoarthritis      InitialRight  AROM InitialRight  PROM   Right  AROM   Right PROM Knee InitialLeft AROM  InitialLeft PROM   Left AROM   Left PROM   130 135   Flexion 130 135     0 0   Extension 0 0     (blank fields were intentionally left blank)    Initial   R   R LE Strength  MMT /5 Initial  L  L   4  Hip Flexion 4    4  Hip Extension 4    4  Hip Abduction 4      Hip Adduction     4 p  Quadriceps 5    4  Hamstrings 4    5  Ankle Dorsiflexion 5    5  Ankle Plantarflexion 5           (blank fields were intentionally left blank)  Objective     Functional Strength   Sit to Stand: UE assist  Squat: partial    Balance   Left   Eyes Open (Left)(sec)      Holding  onto a table     Trial 1: 20 seconds  Right   Eyes Open (Right)(sec)      Holding onto a table - Pain      Trial 1: 15 seconds    Palpation Pain along B joint line - medial aspect and over B PFJ     Flexibility Hs  R: -45 dgrs  L:-40 dgrs     Tests     Left Knee   Negative Apley's compression, Apley's distraction, lateral McMurray and medial McMurray.     Right Knee   Negative Apley's compression, Apley's distraction, lateral McMurray and medial McMurray.              Treatment     Therapeutic Exercises    Pt performed the following exercises and copies were provided for HEP       Access Code: [email or web address]   URL: [email or web address]   Date: 04/01/2018   Prepared by: Drexel Iha      Exercises Prone Quad Stretch with Towel Roll and Strap - 2 reps - 1 sets - 30 s hold - 1x daily - 5x weekly   Sidelying Hip Abduction - 10 reps - 2 sets - 1x daily - 5x weekly   Straight Leg Raise - 10 reps - 2 sets - 1x daily - 5x weekly   Hooklying Hamstring Stretch with Strap - 2 reps - 1 sets - 30 s hold - 1x daily - 5x weekly   Bridge - 10 reps - 2 sets - 5 s hold - 1x daily - 5x weekly   Long Sitting Calf Stretch with Strap - 2 reps - 2 sets - 30 s hold - 1x daily - 5x weekly       Therapeutic Activity   - Performed evaluation for Dx of B knee pain        ---      ---   Total Time   Timed Minutes  48 minutes   Total Time  48 minutes        Assessment   [name] is a 72 y.o.  Female who requires Physical Therapy for the following:  Impairments: Decreased and painful Cervical/ B shoulders ROM; Scapular weakness; Poor posture. B LE's weakness, decreased LE's flexibility . ; decreased staning and walking tolerance.     Pain located: neck pain /B knee pain     Clinical presentation: evolving : pain progressively getting worse  Prior Level of Function: U/a to turn head to side to side ; u/a to look up ; u/a to read > 30 minutes ( 1 h)   difficulty reaching above her head , behind her back and across her body. Pain lifting > 5 lb - Pain sleeping on Left side   Prognosis: good  Plan   Visits per week: 2  Number of Sessions: 12  Direct One on One  29562: Therapeutic Exercise: To Develop Strength and Endurance, ROM and Flexibility  [id number]: Gait Training  13086: Neuromuscular Reeducation  97140: Manual Therapy techniques (mobilization, manipulation, manual traction)  97530: Therapeutic Activities: Dynamic activities to improve functional performance  97035: Ultrasound  Dry Needling  Supervised Modalities  97010: Thermal modalities: hot/cold packs  57846: Electrical stimulation  Pt will be seen for her neck and shoulders and B knee on alternating days .  Next visit:Treat neck and shoulder  UBE  Passive Cervical ROM  Cervical Rotation in supine on 1/2 deflated ball  UT/LEv/Scalenes and SCM stretches   Wheel on  the wall  Shoulder flexion, ABD ,and scaption in front of the mirrow  MOC       Goals    Goal 1:  Patient will demonstrate independence in prescribed HEP with proper form, sets and reps for safe discharge to an independent program.   Sessions:  12   Progression:  new      Goal 2:  Increase active cervical rotation to 65 degrees for increased visual field to allow safe changing lanes and backing up vehicle while driving.   Sessions:  12   Progression:  new       Goal 3:  Increase B shoulder flexion/ ABD AROM to min 165  degrees to allow pt to reach for objects on high shelf/cupboard.   Sessions:   12   Progression:  new      Goal 4:  Increase mid-trap/lower trap strength 4 for maintenance of proper sitting and staning posture    Sessions:  12   Progression:  new          Goal 5:  Increase B hip abduction and extensor strength min 4+5  to allow for ambulation > 45 minutes.   Sessions:  12   Progression:  new      Goal 6:  Able to perform B single leg balance 30  seconds so patient can ambulate on community surfaces with reduced fall risk   Sessions:  12   Progression:  new      Goal 7:  Increase B hamstring flexibility by min 50% to allow patient to stand for 30 minutes to perform AD'sL and house hold activities .   Sessions:  12   Progression:  new                   Drexel Iha, PT

## 2018-04-01 NOTE — PT/OT Therapy Note (Signed)
Name: Ashantae Pangallo Age: 72 y.o.   Date of Service: 04/01/2018  Referring Physician: Heron Nay D, PA   Date of Injury: 01/20/2018  Date Care Plan Established/Reviewed: 03/27/2018  Date Treatment Started: 03/27/2018  End of Certification Date: 06/24/2018  Sessions in Plan of Care: 12  Surgery Date: No data was found    Visit Count: 2   Diagnosis:   1. Neck pain    2. Pain in shoulder region, left    3. Anterior knee pain, left    4. Anterior knee pain, right        Subjective     History of Present Illness   History of Present Illness: Pt reports long history of B knee pain . Treated with Therapy ( last Tx 3 years ago) and injections ( last one a month ago) . Pt relays that her pain had gotten worse but reports significant improvement  after the last injection. According to the pt XR of her knees showed severe arthritis and she was advised that she is a candidate for knee surgery but pt declined.  Functional Limitations (PLOF): Pt is u/a to walk > 20 minutes ( a year ago she was able to walk up to 60" )  . Difficulty negotiating stairs  reciprocally due to pain.    Pt describes herself as sedentary     Pain   B knee pain ( R>L) - pain is constant w/o medicine . Localizes her pain on medial and ant aspect of B knee.     R knee current 0/10 - worst 8/10  L knee current 0/10 - worst 8/10     Social Support/Occupation      Occupation: retired       Precautions: hypertension  diabetes  Allergies: Patient has no known allergies.                        Assessment   Prior Level of Function: Pt is u/a to walk > 20 minutes ( a year ago she was able to walk up to 60" )  . Difficulty negotiating stairs  reciprocally due to pain.    Pt describes herself as sedentary     Goals    Goal 1:  Patient will demonstrate independence in prescribed HEP with proper form, sets and reps for safe discharge to an independent program.   Sessions:  12   Progression:  new      Goal 2:  Increase active cervical rotation to 65 degrees for increased  visual field to allow safe changing lanes and backing up vehicle while driving.   Sessions:  12   Progression:  new       Goal 3:  Increase B shoulder flexion/ ABD AROM to min 165  degrees to allow pt to reach for objects on high shelf/cupboard.   Sessions:  12   Progression:  new      Goal 4:  Increase mid-trap/lower trap strength 4 for maintenance of proper sitting and staning posture    Sessions:  12   Progression:  new                                Drexel Iha, PT

## 2018-04-02 NOTE — Addendum Note (Signed)
Addended by: Drexel Iha on: 04/02/2018 11:24 AM     Modules accepted: Orders

## 2018-04-03 ENCOUNTER — Inpatient Hospital Stay: Payer: Medicare Other | Admitting: Rehabilitative and Restorative Service Providers"

## 2018-04-03 ENCOUNTER — Other Ambulatory Visit: Payer: Self-pay | Admitting: Physician Assistant

## 2018-04-03 DIAGNOSIS — Z87898 Personal history of other specified conditions: Secondary | ICD-10-CM

## 2018-04-03 DIAGNOSIS — M25562 Pain in left knee: Secondary | ICD-10-CM

## 2018-04-03 DIAGNOSIS — M25512 Pain in left shoulder: Secondary | ICD-10-CM

## 2018-04-03 DIAGNOSIS — M542 Cervicalgia: Secondary | ICD-10-CM

## 2018-04-03 DIAGNOSIS — M25561 Pain in right knee: Secondary | ICD-10-CM

## 2018-04-03 NOTE — PT/OT Therapy Note (Signed)
Name: Pamela Booth Age: 72 y.o.   Date of Service: 04/03/2018  Referring Physician: Heron Nay D, PA   Date of Injury: 01/20/2018  Date Care Plan Established/Reviewed: 03/27/2018  Date Treatment Started: 03/27/2018  End of Certification Date: 06/24/2018  Sessions in Plan of Care: 12  Surgery Date: No data was found    Visit Count: 3   Diagnosis:   1. Pain in shoulder region, left    2. Anterior knee pain, left    3. Anterior knee pain, right    4. Neck pain    Neck . Shoulder treatment    Subjective     Pain   Current pain rating: 4  Location: Neck pain   Pt states that she has been doing her HEP and she has no questions at this time     Social Support/Occupation  Lives in: multiple level home  Lives with: adult children  Occupation: retired       Precautions: hypertension  diabetes  Allergies: Patient has no known allergies.                Treatment     Therapeutic Exercises   UBE 6 ' with subjective   - Passive Cervical ROM into all planes   - Supine Shoulder flexion with wand- 10 sec hold - 5x  - SL shoulder ABD- full range- 5 sec hold 5x each  - SL H ABD- 15x each   - Shoulder flexion and ABD in front of mirror 15x each  - reverse push ups 15x  - Scapular squeeze- 5 sec hold - 15x       Manual Therapy   - STM to cervical paraspinals and B UT- pt seated  - Manual cervical traction - 30 sec hold x 6 - {t supine     Modalities   Electrical Stimulation with Heat: Premod 15 min. Location B UT Position  Recumbant   Therapy Rationale: Decrease Pain       ---      ---   Total Time   Timed Minutes  45 minutes   Untimed Minutes  15 minutes   Total Time  60 minutes        Assessment   Pt presents with decreased and painful Cervical ROM / decreased B shoulders ROM; Scapular weakness; Poor posture. Hyperkyphosis compensated by Fw head  positioning and occipital extension noticed.   Plan   Next visit: treat B knees  Next treatment for neck: Add TB ext, TB Rows and TB ER      Goals    Goal 1:  Patient will demonstrate  independence in prescribed HEP with proper form, sets and reps for safe discharge to an independent program.   Sessions:  12   Progression:  new      Goal 2:  Increase active cervical rotation to 65 degrees for increased visual field to allow safe changing lanes and backing up vehicle while driving.   Sessions:  12   Progression:  new       Goal 3:  Increase B shoulder flexion/ ABD AROM to min 165  degrees to allow pt to reach for objects on high shelf/cupboard.   Sessions:  12   Progression:  new      Goal 4:  Increase mid-trap/lower trap strength 4 for maintenance of proper sitting and staning posture    Sessions:  12   Progression:  new          Goal 5:  Increase B hip abduction and extensor strength min 4+5  to allow for ambulation > 45 minutes.   Sessions:  12   Progression:  new      Goal 6:  Able to perform B single leg balance 30  seconds so patient can ambulate on community surfaces with reduced fall risk   Sessions:  12   Progression:  new      Goal 7:  Increase B hamstring flexibility by min 50% to allow patient to stand for 30 minutes to perform AD'sL and house hold activities .   Sessions:  12   Progression:  new                   Drexel Iha, PT

## 2018-04-03 NOTE — PT/OT Therapy Note (Deleted)
Name: Pamela Booth Age: 72 y.o.   Date of Service: 04/03/2018  Referring Physician: Heron Nay D, PA   Date of Injury: 01/20/2018  Date Care Plan Established/Reviewed: 03/27/2018  Date Treatment Started: 03/27/2018  End of Certification Date: 06/24/2018  Sessions in Plan of Care: 12  Surgery Date: No data was found    Visit Count: 3   Diagnosis:   1. Pain in shoulder region, left    2. Anterior knee pain, left    3. Anterior knee pain, right    4. Neck pain             Precautions: hypertension  diabetes  Allergies: Patient has no known allergies.                           Goals    Goal 1:  Patient will demonstrate independence in prescribed HEP with proper form, sets and reps for safe discharge to an independent program.   Sessions:  12   Progression:  new      Goal 2:  Increase active cervical rotation to 65 degrees for increased visual field to allow safe changing lanes and backing up vehicle while driving.   Sessions:  12   Progression:  new       Goal 3:  Increase B shoulder flexion/ ABD AROM to min 165  degrees to allow pt to reach for objects on high shelf/cupboard.   Sessions:  12   Progression:  new      Goal 4:  Increase mid-trap/lower trap strength 4 for maintenance of proper sitting and staning posture    Sessions:  12   Progression:  new          Goal 5:  Increase B hip abduction and extensor strength min 4+5  to allow for ambulation > 45 minutes.   Sessions:  12   Progression:  new      Goal 6:  Able to perform B single leg balance 30  seconds so patient can ambulate on community surfaces with reduced fall risk   Sessions:  12   Progression:  new      Goal 7:  Increase B hamstring flexibility by min 50% to allow patient to stand for 30 minutes to perform AD'sL and house hold activities .   Sessions:  12   Progression:  new                   Drexel Iha, PT

## 2018-04-04 ENCOUNTER — Other Ambulatory Visit: Payer: Self-pay | Admitting: Physician Assistant

## 2018-04-04 DIAGNOSIS — Z87898 Personal history of other specified conditions: Secondary | ICD-10-CM

## 2018-04-08 ENCOUNTER — Inpatient Hospital Stay: Payer: Medicare Other | Admitting: Rehabilitative and Restorative Service Providers"

## 2018-04-08 NOTE — Addendum Note (Signed)
Addended by: Drexel Iha on: 04/08/2018 02:48 PM     Modules accepted: Orders

## 2018-04-10 ENCOUNTER — Inpatient Hospital Stay: Payer: Medicare Other

## 2018-04-10 DIAGNOSIS — M25512 Pain in left shoulder: Secondary | ICD-10-CM

## 2018-04-10 DIAGNOSIS — M542 Cervicalgia: Secondary | ICD-10-CM

## 2018-04-10 DIAGNOSIS — M25561 Pain in right knee: Secondary | ICD-10-CM

## 2018-04-10 DIAGNOSIS — M25562 Pain in left knee: Secondary | ICD-10-CM

## 2018-04-10 NOTE — PT/OT Therapy Note (Signed)
Name: Pamela Booth Age: 72 y.o.   Date of Service: 04/10/2018  Referring Physician: Heron Nay D, PA   Date of Injury: 01/20/2018  Date Care Plan Established/Reviewed: 03/27/2018  Date Treatment Started: 03/27/2018  End of Certification Date: 06/24/2018  Sessions in Plan of Care: 12  Surgery Date: No data was found    Visit Count: 4   Diagnosis:   1. Neck pain    2. Pain in shoulder region, left    3. Anterior knee pain, left    4. Anterior knee pain, right        Subjective     Pain   Pt states she has no pain in her knees to start with today but did take some pain medicine the doctor gave her this morning.    Social Support/Occupation  Lives in: multiple level home  Lives with: adult children  Occupation: retired       Precautions: hypertension  diabetes  Allergies: Patient has no known allergies.                Treatment     Therapeutic Exercises   Justification: To improve flexibility, ROM and strength  Warm up on bike 6'  PROM to B knees  Manual Hamstring, ITB, and gastroc stretches  Supine Ball roll ins 15x3"  B Clamshells 10x  S/L Hip Abduction 10x    Neuromuscular Re-Education   Justification: To improve quad activation  B Quad sets 10x5"  B SLR 10x ea  Bridges with ball sq 15x  B LAQ 15x    Manual Therapy   Justification: To improve mobility of the B knees and decrease pain  Patellar mobs B knees all directions  PA mobs to R tibia grade III   AP mobs to R femur grade III    Modalities   Declined       ---      ---   Total Time   Timed Minutes  40 minutes   Total Time  40 minutes        Assessment   Pt had some discomfort in the R knee during patellar mobs. Pt is lacking extension on the R side so mobs were performed to help improve extension. Exercises given to help improve quad activation and flexibility of B LE. Pt did not have any increased pain after exercises and declined modalities.  Plan   Cotninue with POC. Neck next visit  Add next knee session:   Prone Hamstring curls  Prone hip ext  Step  ups      Goals    Goal 1:  Patient will demonstrate independence in prescribed HEP with proper form, sets and reps for safe discharge to an independent program.   Sessions:  12   Progression:  new      Goal 2:  Increase active cervical rotation to 65 degrees for increased visual field to allow safe changing lanes and backing up vehicle while driving.   Sessions:  12   Progression:  new       Goal 3:  Increase B shoulder flexion/ ABD AROM to min 165  degrees to allow pt to reach for objects on high shelf/cupboard.   Sessions:  12   Progression:  new      Goal 4:  Increase mid-trap/lower trap strength 4 for maintenance of proper sitting and staning posture    Sessions:  12   Progression:  new          Goal  5:  Increase B hip abduction and extensor strength min 4+5  to allow for ambulation > 45 minutes.   Sessions:  12   Progression:  new      Goal 6:  Able to perform B single leg balance 30  seconds so patient can ambulate on community surfaces with reduced fall risk   Sessions:  12   Progression:  new      Goal 7:  Increase B hamstring flexibility by min 50% to allow patient to stand for 30 minutes to perform AD'sL and house hold activities .   Sessions:  12   Progression:  new                   Demetrios Loll, LPTA

## 2018-04-15 ENCOUNTER — Inpatient Hospital Stay: Payer: Medicare Other | Admitting: Rehabilitative and Restorative Service Providers"

## 2018-04-15 DIAGNOSIS — M542 Cervicalgia: Secondary | ICD-10-CM

## 2018-04-15 DIAGNOSIS — M25562 Pain in left knee: Secondary | ICD-10-CM

## 2018-04-15 DIAGNOSIS — M25512 Pain in left shoulder: Secondary | ICD-10-CM

## 2018-04-15 DIAGNOSIS — M25561 Pain in right knee: Secondary | ICD-10-CM

## 2018-04-15 NOTE — PT/OT Therapy Note (Signed)
Name: Pamela Booth Age: 72 y.o.   Date of Service: 04/15/2018  Referring Physician: Heron Nay D, PA   Date of Injury: 01/20/2018  Date Care Plan Established/Reviewed: 03/27/2018  Date Treatment Started: 03/27/2018  End of Certification Date: 06/24/2018  Sessions in Plan of Care: 12  Surgery Date: No data was found    Visit Count: 5   Diagnosis:   1. Neck pain    2. Pain in shoulder region, left    3. Anterior knee pain, left    4. Anterior knee pain, right        Subjective     Pain   Current pain rating: 3  Neck pain - L sided     Social Support/Occupation  Lives in: multiple level home  Lives with: adult children  Occupation: retired       Precautions: hypertension  diabetes  Allergies: Patient has no known allergies.                Treatment     Therapeutic Exercises   Justification: To increase Cervical and shoulder ROM and improve B UE strength   UBE 6 ' with subjective   - Passive Cervical ROM into all planes   - Supine Shoulder flexion with wand- 10 sec hold - 5x  - Wheel on the wall- 20x         Neuromuscular Re-Education   Justification: To improve scapular stabilization to improve posture and shoulder function   - RTB rows 5 sec hold - 15x  - RTB Ext 5 sec hold - 15x  - YTB B ER- 5 sec hold- 15x   - reverse push ups 15x  - Scapular squeeze- 5 sec hold - 15x     Manual Therapy   Justification: To decrease pain   - STM to cervical paraspinals and B UT- pt seated  - Manual cervical traction - 30 sec hold x 6  supine     Modalities   Electrical Stimulation with Heat: Premod 15 min. Location B UT Position  Recumbant   Therapy Rationale: Decrease Pain       ---      ---   Total Time   Timed Minutes  45 minutes   Untimed Minutes  15 minutes   Total Time  60 minutes        Assessment   Pt requires verbal and tactile cues from PT to activate scapular depressors and ADD- pt tendds to compensat with lumbar hyperextention   Plan   Next visit: treat B knees        Goals    Goal 1:  Patient will demonstrate independence  in prescribed HEP with proper form, sets and reps for safe discharge to an independent program.   Sessions:  12   Progression:  new      Goal 2:  Increase active cervical rotation to 65 degrees for increased visual field to allow safe changing lanes and backing up vehicle while driving.   Sessions:  12   Progression:  new       Goal 3:  Increase B shoulder flexion/ ABD AROM to min 165  degrees to allow pt to reach for objects on high shelf/cupboard.   Sessions:  12   Progression:  new      Goal 4:  Increase mid-trap/lower trap strength 4 for maintenance of proper sitting and staning posture    Sessions:  12   Progression:  new  Goal 5:  Increase B hip abduction and extensor strength min 4+5  to allow for ambulation > 45 minutes.   Sessions:  12   Progression:  new      Goal 6:  Able to perform B single leg balance 30  seconds so patient can ambulate on community surfaces with reduced fall risk   Sessions:  12   Progression:  new      Goal 7:  Increase B hamstring flexibility by min 50% to allow patient to stand for 30 minutes to perform AD'sL and house hold activities .   Sessions:  12   Progression:  new                   Drexel Iha, PT

## 2018-04-17 ENCOUNTER — Inpatient Hospital Stay: Payer: Medicare Other

## 2018-04-17 DIAGNOSIS — M25512 Pain in left shoulder: Secondary | ICD-10-CM

## 2018-04-17 DIAGNOSIS — M542 Cervicalgia: Secondary | ICD-10-CM

## 2018-04-17 DIAGNOSIS — M25562 Pain in left knee: Secondary | ICD-10-CM

## 2018-04-17 DIAGNOSIS — M25561 Pain in right knee: Secondary | ICD-10-CM

## 2018-04-17 NOTE — PT/OT Therapy Note (Signed)
Name: Pamela Booth Age: 72 y.o.   Date of Service: 04/17/2018  Referring Physician: Heron Nay D, PA   Date of Injury: 01/20/2018  Date Care Plan Established/Reviewed: 03/27/2018  Date Treatment Started: 03/27/2018  End of Certification Date: 06/24/2018  Sessions in Plan of Care: 12  Surgery Date: No data was found    Visit Count: 6   Diagnosis:   1. Neck pain    2. Pain in shoulder region, left    3. Anterior knee pain, left    4. Anterior knee pain, right        Subjective     Pain   Pt states that she has a little discomfort in her knees from walking a lot.    Social Support/Occupation  Lives in: multiple level home  Lives with: adult children  Occupation: retired       Precautions: hypertension  diabetes  Allergies: Patient has no known allergies.    Balance  IE   Left   Eyes Open (Left)(sec)      Holding onto a table     Trial 1: 20 seconds  Right   Eyes Open (Right)(sec)      Holding onto a table - Pain      Trial 1: 15 seconds  04/17/18   Left   Eyes Open (Left)(sec)      Did not hold on to table-no pain     Trial 1: 6 seconds  Right   Eyes Open (Right)(sec)      Did not hold on to table-no pain      Trial 1: 11 seconds              Treatment     Therapeutic Exercises   Justification: To improve flexibility, ROM and strength  Warm up on bike 6'  PROM to B knees  Manual Hamstring, ITB, and gastroc stretches  Supine Ball roll ins 15x3"  B Clamshells 10x  S/L Hip Abduction 10x  Added prone hip ext 10x  Added prone hamstring curls 10x    Neuromuscular Re-Education   Justification: To improve quad activation  B Quad sets 10x5"  B SLR 10x ea  Bridges with ball sq 15x  B LAQ 15x    Manual Therapy   Justification: To improve mobility of the B knees and decrease pain  Patellar mobs B knees all directions  PA mobs to R tibia grade III   AP mobs to R femur grade III    Took balance measurement    Modalities   Declined       ---      ---   Total Time   Timed Minutes  45 minutes   Total Time  45 minutes        Assessment    Pt is still unable to get full extension on the R LE so mobs performed on that side to assist with motion. Extension exercises added today to improve strength. Pt was able to start holding SLS without holding on to the table and with no pain this visit. Pt requires cues during S/L abduction exercises for form, not to roll back ward and stay on side. No increased pain after exercises.  Plan   Continue with POC. Neck next session  Next knee session add gait/stair training      Goals    Goal 1:  Patient will demonstrate independence in prescribed HEP with proper form, sets and reps for safe discharge to an independent program.  Sessions:  12   Progression:  new      Goal 2:  Increase active cervical rotation to 65 degrees for increased visual field to allow safe changing lanes and backing up vehicle while driving.   Sessions:  12   Progression:  new       Goal 3:  Increase B shoulder flexion/ ABD AROM to min 165  degrees to allow pt to reach for objects on high shelf/cupboard.   Sessions:  12   Progression:  new      Goal 4:  Increase mid-trap/lower trap strength 4 for maintenance of proper sitting and staning posture    Sessions:  12   Progression:  new          Goal 5:  Increase B hip abduction and extensor strength min 4+5  to allow for ambulation > 45 minutes.   Sessions:  12   Progression:  new      Goal 6:  Able to perform B single leg balance 30  seconds so patient can ambulate on community surfaces with reduced fall risk   Sessions:  12   Progression:  new      Goal 7:  Increase B hamstring flexibility by min 50% to allow patient to stand for 30 minutes to perform AD'sL and house hold activities .   Sessions:  12   Progression:  new                   Demetrios Loll, LPTA

## 2018-04-22 ENCOUNTER — Inpatient Hospital Stay: Payer: Medicare Other | Attending: Physician Assistant | Admitting: Rehabilitative and Restorative Service Providers"

## 2018-04-22 DIAGNOSIS — M25561 Pain in right knee: Secondary | ICD-10-CM | POA: Insufficient documentation

## 2018-04-22 DIAGNOSIS — M25562 Pain in left knee: Secondary | ICD-10-CM | POA: Insufficient documentation

## 2018-04-22 DIAGNOSIS — M25512 Pain in left shoulder: Secondary | ICD-10-CM | POA: Insufficient documentation

## 2018-04-22 DIAGNOSIS — M542 Cervicalgia: Secondary | ICD-10-CM | POA: Insufficient documentation

## 2018-04-22 NOTE — PT/OT Therapy Note (Signed)
Name: Pamela Booth Age: 72 y.o.   Date of Service: 04/22/2018  Referring Physician: Heron Nay D, PA   Date of Injury: 01/20/2018  Date Care Plan Established/Reviewed: 03/27/2018  Date Treatment Started: 03/27/2018  End of Certification Date: 06/24/2018  Sessions in Plan of Care: 12  Surgery Date: No data was found    Visit Count: 7   Diagnosis:   1. Neck pain    2. Pain in shoulder region, left    3. Anterior knee pain, left    4. Anterior knee pain, right        Subjective     Pain   Current pain rating: 3 was 6/10 at her IE   At worst pain rating: 5 was 6/10 at her IE   Neck pain- intermittent was constant at her IE - pain referred  Upper thoracic spine     L shoulder pain - intermittent - Superior aspect -  Pain 0/10 was 3/10 at her IE - worst  4/10 was 6/10 at her IE       Social Support/Occupation  Lives in: multiple level home  Lives with: adult children  Occupation: retired       Precautions: hypertension  diabetes  Allergies: Patient has no known allergies.          AROM: Cervical Spine   Initial  10/1    Flexion 45  45    Extension 30 p  40     R L R L   Rotation 50 p 30 p 60 60   Side Bending 20 p 10 p 20 20   (blank fields were intentionally left blank)             Treatment     Therapeutic Exercises   Justification: To increase Cervical and shoulder ROM and improve B UE strength   UBE 6 ' with subjective   - Passive Cervical ROM into all planes   - Supine Shoulder flexion with wand- 10 sec hold - 5x  - SL open book- 5 sec hold - 5 x each           Neuromuscular Re-Education   Justification: To improve scapular stabilization to improve posture and shoulder function   - RTB rows 5 sec hold - 15x  - RTB Ext 5 sec hold - 15x  - YTB B ER- 5 sec hold- 15x   - reverse push ups 15x  - Wall push ups- 15x       Manual Therapy   Justification: To decrease pain   - STM to cervical paraspinals and B UT- pt seated  - Manual cervical traction - 30 sec hold x 6  supine     Modalities   Electrical  Stimulation with Heat: Premod 15 min. Location B UT Position  Recumbant   Therapy Rationale: Decrease Pain       ---      ---   Total Time   Timed Minutes  45 minutes   Untimed Minutes  15 minutes   Total Time  60 minutes        Assessment   Cervical ROM has improved since her IE into all planes. Pt also reports less neck pain with head movements. Pt needs cues to improved posture as she tends to carry her head on a Fw positioning with shoulder rounded.   Plan   Next visit: treat B knees  Next visit for neck: Review and update HEP  Goals    Goal 1:  Patient will demonstrate independence in prescribed HEP with proper form, sets and reps for safe discharge to an independent program.   Sessions:  12   Progression:  new      Goal 2:  Increase active cervical rotation to 65 degrees for increased visual field to allow safe changing lanes and backing up vehicle while driving.   Sessions:  12   Progression:  new       Goal 3:  Increase B shoulder flexion/ ABD AROM to min 165  degrees to allow pt to reach for objects on high shelf/cupboard.   Sessions:  12   Progression:  new      Goal 4:  Increase mid-trap/lower trap strength 4 for maintenance of proper sitting and staning posture    Sessions:  12   Progression:  new          Goal 5:  Increase B hip abduction and extensor strength min 4+5  to allow for ambulation > 45 minutes.   Sessions:  12   Progression:  new      Goal 6:  Able to perform B single leg balance 30  seconds so patient can ambulate on community surfaces with reduced fall risk   Sessions:  12   Progression:  new      Goal 7:  Increase B hamstring flexibility by min 50% to allow patient to stand for 30 minutes to perform AD'sL and house hold activities .   Sessions:  12   Progression:  new                   Drexel Iha, PT

## 2018-04-25 ENCOUNTER — Inpatient Hospital Stay: Payer: Medicare Other | Admitting: Rehabilitative and Restorative Service Providers"

## 2018-04-25 DIAGNOSIS — M25562 Pain in left knee: Secondary | ICD-10-CM

## 2018-04-25 DIAGNOSIS — M25512 Pain in left shoulder: Secondary | ICD-10-CM

## 2018-04-25 DIAGNOSIS — M25561 Pain in right knee: Secondary | ICD-10-CM

## 2018-04-25 DIAGNOSIS — M542 Cervicalgia: Secondary | ICD-10-CM

## 2018-04-25 NOTE — PT/OT Therapy Note (Signed)
Name: Pamela Booth Age: 72 y.o.   Date of Service: 04/25/2018  Referring Physician: Heron Nay D, PA   Date of Injury: 01/20/2018  Date Care Plan Established/Reviewed: 03/27/2018  Date Treatment Started: 03/27/2018  End of Certification Date: 06/24/2018  Sessions in Plan of Care: 12  Surgery Date: No data was found    Visit Count: 8   Diagnosis:   1. Neck pain    2. Pain in shoulder region, left    3. Anterior knee pain, left    4. Anterior knee pain, right        Subjective     Pain   Current pain rating: 0  At worst pain rating: 6  Pt reports 40% improvement since her IE . B knee pain ( R>L) - pain is constant w/o medicine . Localizes her pain on medial and ant aspect of B knee.           Social Support/Occupation  Lives in: multiple level home  Lives with: adult children  Occupation: retired       Precautions: hypertension  diabetes  Allergies: Patient has no known allergies.            Flexibility Hs  R: -45 dgrs  L:-40 dgrs          Treatment     Therapeutic Exercises   Justification: To improve flexibility, ROM and strength  -Warm up on bike 6' with subjective  - B Hs, Qs,  Hamstring, ITB, Psoas and  gastroc stretches- 30 sec hold x 2 each   - B SLR-  2 x 10  each  - B SL ABD - 15x each  - B Clams- 20x each      Access Code: [email or web address]   URL: [email or web address]  Date: 04/25/2018   Prepared by: Drexel Iha      Exercises Prone Quad Stretch with Towel Roll and Strap - 2 reps - 1 sets - 30 s hold - 1x daily - 5x weekly   Hooklying Hamstring Stretch with Strap - 2 reps - 1 sets - 30 s hold - 1x daily - 5x weekly   Long Sitting Calf Stretch with Strap - 2 reps - 1 sets - 30 s hold - 1x daily - 5x weekly   Supine ITB Stretch with Strap - 2 reps - 1 sets - 30 s hold - 1x daily - 5x weekly   Hip Flexor Stretch at Edge of Bed - 2 reps - 1 sets - 30 s hold - 1x daily - 5x weekly   Sidelying Hip Abduction - 10 reps - 2 sets - 1x daily - 3x weekly   Clamshell - 10 reps - 3 sets - 1x daily - 3x weekly    Straight Leg Raise - 10 reps - 2 sets - 1x daily - 3x weekly   Bridge - 10 reps - 2 sets - 5 s hold - 1x daily - 3x weekly   Step Up - 15 reps - 1 sets - 1x daily - 3x weekly   Forward Step Down - 15 reps - 1 sets - 1x daily - 3x weekly         Neuromuscular Re-Education   Justification: To improve quad activation  -Bridges with VMO- 5 sec hold - 15x  - Step Fw up- 6" step- 15x each  - Step Fw down- 6" step- 15x each      Manual Therapy   Justification: To improve mobility of the B  knees and decrease pain  - Patellar mobs B knees all directions  - STM to B ITB with Pt SL         Modalities   Declined       ---      ---   Total Time   Timed Minutes  50 minutes   Total Time  50 minutes        Assessment   Complete HEP for LE's strengthening and flexibility update, reviewed and performed with pt. Pt demonstrated good understating and proper form with HEP.    Plan   Next visit: Treat neck- please Review and update HEP for neck and shoulder  RE with primary PT on visit 10th ( 05/02/2018)         Goals    Goal 1:  Patient will demonstrate independence in prescribed HEP with proper form, sets and reps for safe discharge to an independent program.   Sessions:  12   Progression:  new      Goal 2:  Increase active cervical rotation to 65 degrees for increased visual field to allow safe changing lanes and backing up vehicle while driving.   Sessions:  12   Progression:  new       Goal 3:  Increase B shoulder flexion/ ABD AROM to min 165  degrees to allow pt to reach for objects on high shelf/cupboard.   Sessions:  12   Progression:  new      Goal 4:  Increase mid-trap/lower trap strength 4 for maintenance of proper sitting and staning posture    Sessions:  12   Progression:  new          Goal 5:  Increase B hip abduction and extensor strength min 4+5  to allow for ambulation > 45 minutes.   Sessions:  12   Progression:  new      Goal 6:  Able to perform B single leg balance 30  seconds so patient can ambulate on community  surfaces with reduced fall risk   Sessions:  12   Progression:  new      Goal 7:  Increase B hamstring flexibility by min 50% to allow patient to stand for 30 minutes to perform AD'sL and house hold activities .   Sessions:  12   Progression:  new                   Drexel Iha, PT

## 2018-04-29 ENCOUNTER — Inpatient Hospital Stay: Payer: Medicare Other

## 2018-04-29 DIAGNOSIS — M25512 Pain in left shoulder: Secondary | ICD-10-CM

## 2018-04-29 DIAGNOSIS — M542 Cervicalgia: Secondary | ICD-10-CM

## 2018-04-29 DIAGNOSIS — M25561 Pain in right knee: Secondary | ICD-10-CM

## 2018-04-29 DIAGNOSIS — M25562 Pain in left knee: Secondary | ICD-10-CM

## 2018-04-29 NOTE — PT/OT Therapy Note (Signed)
Name: Pamela Booth Age: 72 y.o.   Date of Service: 04/29/2018  Referring Physician: Heron Nay D, PA   Date of Injury: 01/20/2018  Date Care Plan Established/Reviewed: 03/27/2018  Date Treatment Started: 03/27/2018  End of Certification Date: 06/24/2018  Sessions in Plan of Care: 12  Surgery Date: No data was found    Visit Count: 9   Diagnosis:   1. Neck pain    2. Pain in shoulder region, left    3. Anterior knee pain, left    4. Anterior knee pain, right        Subjective     Pain   Pt states she is having a little pain in her neck mostly on the L side.    Social Support/Occupation  Lives in: multiple level home  Lives with: adult children  Occupation: retired       Precautions: hypertension  diabetes  Allergies: Patient has no known allergies.                Treatment     Therapeutic Exercises   Justification: To increase Cervical and shoulder ROM and improve B UE strength   UBE 6 ' with subjective   - Passive Cervical ROM into all planes   - Supine Shoulder flexion with wand- 10 sec hold - 5x  - SL open book- 5 sec hold - 5 x each    Updated HEP:   Access Code: [email or web address]   URL: [email or web address]   Date: 04/29/2018   Prepared by: Demetrios Loll      Exercises Supine Shoulder Flexion with Dowel - 10 reps - 2 sets - 5 hold - 1x daily - 7x weekly   Sidelying Thoracic Rotation with Open Book - 10 reps - 5 hold - 1x daily - 7x weekly   Standing Shoulder External Rotation with Resistance - 10 reps - 2 sets - 5 hold - 1x daily - 7x weekly   Wall Push Up - 10 reps - 2 sets - 1x daily - 7x weekly   Supine Shoulder Horizontal Abduction with Resistance - 10 reps - 2 sets - 1x daily - 7x weekly     Neuromuscular Re-Education   Justification: To improve scapular stabilization to improve posture and shoulder function   - RTB rows 5 sec hold - 15x  - RTB Ext 5 sec hold - 15x  - YTB B ER- 5 sec hold- 15x  - reverse push ups 15x  - Wall push ups- 15x  Added supine YTB Horiz Abd 15x    Manual Therapy   Justification: To  decrease pain   - STM to cervical paraspinals and B UT- pt seated  - Manual cervical traction - 30 sec hold x 6  supine    Modalities   Electrical Stimulation with Heat: Premod 15 min. Location B UT Position  Recumbant   Therapy Rationale: Decrease Pain       ---      ---   Total Time   Timed Minutes  43 minutes   Untimed Minutes  15 minutes   Total Time  58 minutes        Assessment   Pt had some tenderness on the L side of the neck but felt better after MT. HEP was updated for neck and shoulders and was reviewed with the pt. Supine Horiz abd was added to improve stabilization and posture. Pt required some cues during reverse wall push ups and rows for proper form. Pt did  not report any increased pain after the exercises.  Plan   Continue with POC. Re-eval with therapist next session.      Goals    Goal 1:  Patient will demonstrate independence in prescribed HEP with proper form, sets and reps for safe discharge to an independent program.   Sessions:  12   Progression:  new      Goal 2:  Increase active cervical rotation to 65 degrees for increased visual field to allow safe changing lanes and backing up vehicle while driving.   Sessions:  12   Progression:  new       Goal 3:  Increase B shoulder flexion/ ABD AROM to min 165  degrees to allow pt to reach for objects on high shelf/cupboard.   Sessions:  12   Progression:  new      Goal 4:  Increase mid-trap/lower trap strength 4 for maintenance of proper sitting and staning posture    Sessions:  12   Progression:  new          Goal 5:  Increase B hip abduction and extensor strength min 4+5  to allow for ambulation > 45 minutes.   Sessions:  12   Progression:  new      Goal 6:  Able to perform B single leg balance 30  seconds so patient can ambulate on community surfaces with reduced fall risk   Sessions:  12   Progression:  new      Goal 7:  Increase B hamstring flexibility by min 50% to allow patient to stand for 30 minutes to perform AD'sL and house hold  activities .   Sessions:  12   Progression:  new                   Demetrios Loll, LPTA

## 2018-05-02 ENCOUNTER — Inpatient Hospital Stay: Payer: Medicare Other | Admitting: Rehabilitative and Restorative Service Providers"

## 2018-05-02 DIAGNOSIS — M542 Cervicalgia: Secondary | ICD-10-CM

## 2018-05-02 DIAGNOSIS — M25561 Pain in right knee: Secondary | ICD-10-CM

## 2018-05-02 DIAGNOSIS — M25512 Pain in left shoulder: Secondary | ICD-10-CM

## 2018-05-02 DIAGNOSIS — M25562 Pain in left knee: Secondary | ICD-10-CM

## 2018-05-02 NOTE — Progress Notes (Signed)
Name:Pamela Booth Age: 72 y.o.   Today's Date: 05/07/2018  Referring Physician: Heron Nay D, PA   Date of Injury: 01/20/2018  Date Care Plan Established/Reviewed: 03/27/2018  Date Treatment Started: 03/27/2018  End of Certification Date: 06/24/2018  Sessions in Plan of Care: 12  Surgery Date: No data was found      Visit Count: 10   Diagnosis:   1. Neck pain    2. Pain in shoulder region, left    3. Anterior knee pain, left    4. Anterior knee pain, right        Subjective     History of Present Illness   History of Present Illness:         Functional Limitations (PLOF):  Pt was seen 10 visits for the above diagnoses and reports 50% improvement  since her IE . Pt made good progress with increased Cervical/ L shoulder ROM and  showed gains on B UE / LE's strength and flexibility . Pt requested to be d/c today due to her leaving the country. Pt is being d/c today with a complete HEP to continue working on generalized flexibility and strengthening. Pt demonstrated good understanding and proper form with HEP.    Pain   Current pain rating: 0  At worst pain rating: 3  Pt reports 50% improvement from her neck, shoulder and B knee since her IE. Pt states that she is able to turn her head side to side pain free and that she uses her L UE for all ADL's.               Social Support/Occupation  Lives in: multiple level home  Lives with: adult children  Occupation: retired       Precautions: hypertension  diabetes  Patient has no known allergies.  Past Medical History:   Diagnosis Date   . Diabetes mellitus    . Hypertension    . Osteoarthritis        Objective     Posture     Head  Forward.    Shoulders  Rounded.    Thoracic Spine  Hyperkyphosis.    Tests   Cervical     Left (Cervical)   Negative Spurling's sign and cervical distraction.     Right (Cervical)   Negative Spurling's sign and cervical distraction.     Thoracic   Negative slump.     Left Shoulder   Negative compression test.     Right Shoulder   Negative  compression test.   Lumbar/Pelvic Girdle/Sacrum   Negative slump.             AROM: Cervical Spine   Initial  10/11    Flexion 45  45    Extension 30 p  45     R L R L   Rotation 50 p 30 p 60 60   Side Bending 20 p 10 p 30 30   (blank fields were intentionally left blank)          InitialRight  AROM InitialRight  PROM   Right  AROM  10/11   Right PROM  10/11 Shoulder InitialLeft AROM InitialLeft PROM   Left AROM  10/11   Left PROM  10/11   150 155 170 175 Flexion 150 155 170 175       Extension       150 155 165 165 Abduction 150 155 165 165   90 90 90 90 Internal Rotation 90 90 90  90   90 90 90 90 External Rotation 90 90 90 90              (blank fields were intentionally left blank)      Initial R   R  10/11 UE Strength  MMT /5 Initial  L   L  10/11   4+ 5 Shoulder Flexion 4+ 5   4 5  Shoulder Abduction (C5) 4 5   4+  Shoulder Extension 4+    4 4+ Shoulder External Rotation 4 4+   4 4+ Shoulder Internal Rotation 4 4+   3+ 4+ Rhomboids 3+ 3+     Serratus       Upper Trapezius (C4)     3+ 3+ Middle Trapezius (C4) 3+ 3+   3 3+ Lower Trapezius (C4) 3 3+   5 5 Biceps (C6) 5 5   5 5  Triceps (C7) 5 5   5 5  Wrist Extension (C7) 5 5   5 5  Wrist Flexion (C7) 5 5   (blank fields were intentionally left blank)      Right  AROM Right  PROM Knee Left AROM Left PROM   130 135 Flexion 130 135   0 0 Extension 0 0   (blank fields were intentionally left blank)    Initial   R   R  10/11 LE Strength  MMT /5 Initial  L  L  10/11   4 4+ Hip Flexion 4 4+   4 4+ Hip Extension 4 4+   4 4+ Hip Abduction 4 4+     Hip Adduction     4 p  Quadriceps 5    4 4+ Hamstrings 4 4+   5 5 Ankle Dorsiflexion 5 5   5 5  Ankle Plantarflexion 5 5          (blank fields were intentionally left blank)  Objective     Functional Strength   Sit to Stand: UE assist  Squat: partial    Balance   Left   Eyes Open (Left)(sec)      Holding onto a table     Trial 1: 20 seconds  Right    Eyes Open (Right)(sec)      Holding onto a table - Pain      Trial 1: 15 seconds    Palpation Pain along B joint line - medial aspect and over B PFJ     Flexibility Hs  R: - 15 dgrs was 45 dgrs at IE  L:-  15 sgrs  Was 40 dgrs at IE     Tests       Treatment     Therapeutic Exercises        Therapeutic Activity   Reassessment for d/c and progress report. Complete HEP reviewed with HEP . All questions answered to pt's satisfaction.             ---      ---   Total Time   Timed Minutes  45 minutes   Total Time  45 minutes        Assessment   Prior Level of Function:  Pt was seen 10 visits for the above diagnoses and reports 50% improvement  since her IE . Pt made good progress with increased Cervical/ L shoulder ROM and  showed gains on B UE / LE's strength and flexibility . Pt requested to be d/c today due to her leaving the country. Pt is being d/c today with a  complete HEP to continue working on generalized flexibility and strengthening. Pt demonstrated good understanding and proper form with HEP.  Plan   D/c with HEP       Goals    Goal 1:  Patient will demonstrate independence in prescribed HEP with proper form, sets and reps for safe discharge to an independent program.    05/02/18  Pt demonstrated good understanding and proper form with  HEP   Sessions:  12   Progression:  met      Goal 2:  Increase active cervical rotation to 65 degrees for increased visual field to allow safe changing lanes and backing up vehicle while driving.      05/02/18  B C- ROT 60 dgrs    Sessions:  12      Goal 3:  Increase B shoulder flexion/ ABD AROM to min 165  degrees to allow pt to reach for objects on high shelf/cupboard.      05/02/18  B shoulder Flexion Active 170 dgrs / Passive 175 dgrs   B shoulder ABD Active / Passive 165 dgrs    Sessions:  12   Progression:  met      Goal 4:  Increase mid-trap/lower trap strength 4 for maintenance of proper sitting and staning posture     05/02/18  Scapular strength 3+/5   Sessions:   12          Goal 5:  Increase B hip abduction and extensor strength min 4+5  to allow for ambulation > 45 minutes.    05/02/18  B hip ABD and Ext strength 4+/5    Sessions:  12   Progression:  met      Goal 6:  Able to perform B single leg balance 30  seconds so patient can ambulate on community surfaces with reduced fall risk    05/02/18    Left   Eyes Open (Left)(sec)      Holding onto a table     Trial 1: 20 seconds  Right   Eyes Open (Right)(sec)      Holding onto a table - Pain      Trial 1: 15 seconds     Sessions:  12      Goal 7:  Increase B hamstring flexibility by min 50% to allow patient to stand for 30 minutes to perform AD'sL and house hold activities .      05/02/18  R:  - 15 dgrs was 45 dgrs at IE  L:  - 15 sgrs  Was 40 dgrs at IE    Sessions:  12   Progression:  met                   Drexel Iha, PT

## 2018-05-06 ENCOUNTER — Inpatient Hospital Stay: Payer: Medicare Other | Admitting: Rehabilitative and Restorative Service Providers"

## 2019-08-11 ENCOUNTER — Encounter (INDEPENDENT_AMBULATORY_CARE_PROVIDER_SITE_OTHER): Payer: Self-pay | Admitting: Neurological Surgery

## 2019-08-13 NOTE — Progress Notes (Signed)
[facility] Neurosurgery  New Patient Note    Date: 08/19/2019  Patient Name: Pamela Booth, Pamela Booth  [id number]  Patient Care Team:  Patsy Lager, MD as PCP - General  Referring provider:   Patsy Lager, MD      History of Present Illness:     Chief Complaint   Patient presents with    Initial Consult     Headache, red eyes, spotty vision; referred by Dr. Virgina Organ       I was asked by   Patsy Lager, MD to see Pamela Booth in consultation in order to render my professional opinion regarding the aforementioned chief complaint.    [name] is a 74 y.o. female who presents for evaluation of her left MCA bifurcation unruptured aneurysm. She had an MRI brain for work-up for left sided headache for three months. Headaches are controlled with medications. No family history of aneurysm. She does not smoke.    History was obtained from chart review and the patient.  On site interpreter, Insha Salls, CLAS was available during the entire encounter.      Past Medical History:     Past Medical History:   Diagnosis Date    Diabetes mellitus     Hyperlipidemia     Hypertension     Osteoarthritis        Past Surgical History:     Past Surgical History:   Procedure Laterality Date    BREAST SURGERY         Family History:     No family history on file.    Social History:     Social History     Socioeconomic History    Marital status: Unknown     Spouse name: None    Number of children: None    Years of education: None    Highest education level: None   Occupational History    None   Social Engineer, site strain: None    Food insecurity     Worry: None     Inability: None    Transportation needs     Medical: None     Non-medical: None   Tobacco Use    Smoking status: Never Smoker    Smokeless tobacco: Never Used   Substance and Sexual Activity    Alcohol use: Never     Frequency: Never     Binge frequency: Never    Drug use: Never    Sexual activity: Yes     Partners: Male     Birth  control/protection: None   Lifestyle    Physical activity     Days per week: None     Minutes per session: None    Stress: None   Relationships    Social connections     Talks on phone: None     Gets together: None     Attends religious service: None     Active member of club or organization: None     Attends meetings of clubs or organizations: None     Relationship status: None    Intimate partner violence     Fear of current or ex partner: None     Emotionally abused: None     Physically abused: None     Forced sexual activity: None   Other Topics Concern    None   Social History Narrative    None       Allergies:     No  Known Allergies    Medications:     Current Outpatient Medications on File Prior to Visit   Medication Sig Dispense Refill    Empagliflozin (JARDIANCE PO) Take by mouth      LOSARTAN POTASSIUM PO Take 50 mg by mouth      omeprazole (PriLOSEC) 40 MG capsule Take 40 mg by mouth daily       No current facility-administered medications on file prior to visit.        Review of Systems:     Constitutional: Negative.    HENT: Negative.    Eyes: Positive for pain and visual disturbance.   Respiratory: Negative.    Cardiovascular: Negative.    Gastrointestinal: Negative.    Endocrine: Negative.    Genitourinary: Negative.    Musculoskeletal: Positive for arthralgias.   Skin: Negative.    Allergic/Immunologic: Negative.    Neurological: Positive for headaches.   Hematological: Negative.    Psychiatric/Behavioral: Negative.    All other systems reviewed and are negative.      Vital Signs:     Vitals:    08/19/19 0922   BP: 145/81   Pulse: (!) 108   Temp: 98.2 F (36.8 C)       Physical Exam:        General: The patient was well developed and well nourished.  No acute distress. Cooperative with the examination  Neck: Trachea midline, no thyromegaly  Pulmonary: normal respiratory effort. No audible wheezing.   Cardiovascular: no diaphoresis   Abdomen: Soft, non-distended  Extremities: No pedal  edema, normal in color  Skin: Normal temperature, no rash  Mental Status: The patient is awake, alert and oriented to person, place, and time.    Affect is normal. Fund of knowledge appropriate.   Recent and remote memory are intact. Attention span and concentration appear normal.  No delusions or hallucinations. Language function is normal.   There is no evidence of aphasia in conversational speech.  Cranial nerves:   -CN II: Visual fields full to bedside confrontation   -CN III, IV, VI: Pupils equal, round, and reactive to light; extraocular movements intact; no ptosis                                          -CN VII: Face symmetric (above mask)  -CN VIII: Hearing intact to conversational speech   -CN IX, X:  normal phonation   -CN XI: Symmetric full strength of sternocleidomastoid and trapezius muscles   Motor: Muscle tone normal without spasticity or flaccidity. No atrophy.    BUE/BLE 5/5  Coordination: No tremors  Gait: Station normal, gait stable   GCS 15      Labs:     No results found for: WBC, HGB, HCT, MCV, PLT  No results found for: NA, K, CL, CO2  No results found for: INR, PT  No results found for: BUN  No results found for: CREAT      Imaging:     I personally reviewed the patient's images.    sentara   MRI BRAIN WITHOUT AND WITH CONTRAST   FINDINGS:  Brain: There is no evidence of acute infarct, mass, midline shift, or extra-axial fluid collection. A single punctate focus of susceptibility artifact in the left temporal lobe suggests a chronic microhemorrhage. Patchy T2 hyperintensities in the cerebral white matter bilaterally are nonspecific but compatible with moderately advanced  chronic small vessel ischemic disease. The ventricles and sulci are within normal limits for age. No abnormal enhancement is identified.  Vascular: Major intracranial vascular flow voids are preserved. There is a bulbous appearance of the left MCA bifurcation compatible with an aneurysm and suspected to measure at least 5 mm  in size.  Skull and upper cervical spine: Unremarkable bone marrow signal.  Sinuses/orbits: The visualized paranasal sinuses and mastoid air cells are clear. The orbits are unremarkable.  IMPRESSION  1. Left MCA bifurcation aneurysm. Head MRA or CTA is recommended for complete characterization.  2. No acute intracranial abnormality.  3. Moderate chronic small vessel ischemic disease  Reading location: [place], [place]  Signed By: Prudy Feeler, MD on 08/05/2019 9:43 AM    Impression & Plan   74 y.o. female with incidentally found left MCA bifurcation aneurysm on MRI brain.  The cerebral vasculature is incompletely evaluated on MRI, but the aneurysm appears at least 5mm in diameter.      We discussed the natural history of unruptured cerebral aneurysms, which carry a 1-2% annual risk of rupture, which can increase with increasing size or irregularity.  She will need diagnostic cerebral angiogram for further evaluation of the aneurysm in preparation for potential treatment.  The patient and her son are in agreement with the plan.        Orders Placed This Encounter   Procedures    Coronavirus, COVID-19    CBC without differential    Basic Metabolic Panel    Prothrombin time/INR       Follow up:   angiogram    Thank you for the opportunity of allowing me to participate in the care of  Pamela Booth.      Binnie Rail, MD  Cristie Nash Shearer, NP    All relevant and clinical information was transcribed by me, Cristie Nash Shearer, NP,  acting as a scribe for Binnie Rail, MD.

## 2019-08-17 ENCOUNTER — Encounter (INDEPENDENT_AMBULATORY_CARE_PROVIDER_SITE_OTHER): Payer: Self-pay

## 2019-08-19 ENCOUNTER — Ambulatory Visit (INDEPENDENT_AMBULATORY_CARE_PROVIDER_SITE_OTHER): Payer: PRIVATE HEALTH INSURANCE | Admitting: Neurological Surgery

## 2019-08-19 ENCOUNTER — Encounter (INDEPENDENT_AMBULATORY_CARE_PROVIDER_SITE_OTHER): Payer: Self-pay | Admitting: Neurological Surgery

## 2019-08-19 DIAGNOSIS — I671 Cerebral aneurysm, nonruptured: Secondary | ICD-10-CM

## 2019-08-19 NOTE — Progress Notes (Signed)
Review of Systems   Review of Systems   Constitutional: Negative.    HENT: Negative.    Eyes: Positive for pain and visual disturbance.   Respiratory: Negative.    Cardiovascular: Negative.    Gastrointestinal: Negative.    Endocrine: Negative.    Genitourinary: Negative.    Musculoskeletal: Positive for arthralgias.   Skin: Negative.    Allergic/Immunologic: Negative.    Neurological: Positive for headaches.   Hematological: Negative.    Psychiatric/Behavioral: Negative.    All other systems reviewed and are negative.    Taken by: Cleda Clarks, LPN

## 2019-08-26 ENCOUNTER — Encounter (INDEPENDENT_AMBULATORY_CARE_PROVIDER_SITE_OTHER): Payer: Self-pay

## 2019-08-27 ENCOUNTER — Encounter (INDEPENDENT_AMBULATORY_CARE_PROVIDER_SITE_OTHER): Payer: Self-pay | Admitting: Neurological Surgery

## 2019-08-28 ENCOUNTER — Other Ambulatory Visit (INDEPENDENT_AMBULATORY_CARE_PROVIDER_SITE_OTHER): Payer: Self-pay | Admitting: Nurse Practitioner

## 2019-08-28 DIAGNOSIS — I671 Cerebral aneurysm, nonruptured: Secondary | ICD-10-CM

## 2019-08-31 ENCOUNTER — Encounter (INDEPENDENT_AMBULATORY_CARE_PROVIDER_SITE_OTHER): Payer: Self-pay | Admitting: Nurse Practitioner

## 2019-09-06 ENCOUNTER — Ambulatory Visit (INDEPENDENT_AMBULATORY_CARE_PROVIDER_SITE_OTHER): Payer: PRIVATE HEALTH INSURANCE

## 2019-09-14 ENCOUNTER — Encounter: Payer: Self-pay | Admitting: Neurological Surgery

## 2019-09-21 ENCOUNTER — Ambulatory Visit (INDEPENDENT_AMBULATORY_CARE_PROVIDER_SITE_OTHER): Payer: PRIVATE HEALTH INSURANCE

## 2019-09-21 DIAGNOSIS — Z20822 Contact with and (suspected) exposure to covid-19: Secondary | ICD-10-CM

## 2019-09-21 DIAGNOSIS — Z01818 Encounter for other preprocedural examination: Secondary | ICD-10-CM

## 2019-09-22 LAB — COVID-19 (SARS-COV-2): SARS CoV 2 Overall Result: NOT DETECTED

## 2019-09-24 ENCOUNTER — Encounter: Payer: Self-pay | Admitting: Neurological Surgery

## 2019-09-25 ENCOUNTER — Telehealth (INDEPENDENT_AMBULATORY_CARE_PROVIDER_SITE_OTHER): Payer: Self-pay | Admitting: Nurse Practitioner

## 2019-09-25 ENCOUNTER — Ambulatory Visit
Admission: RE | Admit: 2019-09-25 | Discharge: 2019-09-25 | Disposition: A | Discharge status: Home / Self Care | Payer: Medicare Managed Care Other | Source: Ambulatory Visit | Attending: Neurological Surgery | Admitting: Neurological Surgery

## 2019-09-25 ENCOUNTER — Encounter
Admission: RE | Disposition: A | Discharge status: Home / Self Care | Payer: Self-pay | Source: Ambulatory Visit | Attending: Neurological Surgery

## 2019-09-25 ENCOUNTER — Other Ambulatory Visit (INDEPENDENT_AMBULATORY_CARE_PROVIDER_SITE_OTHER): Payer: Self-pay | Admitting: Nurse Practitioner

## 2019-09-25 DIAGNOSIS — I671 Cerebral aneurysm, nonruptured: Secondary | ICD-10-CM

## 2019-09-25 DIAGNOSIS — E785 Hyperlipidemia, unspecified: Secondary | ICD-10-CM | POA: Insufficient documentation

## 2019-09-25 DIAGNOSIS — I10 Essential (primary) hypertension: Secondary | ICD-10-CM | POA: Insufficient documentation

## 2019-09-25 DIAGNOSIS — E119 Type 2 diabetes mellitus without complications: Secondary | ICD-10-CM | POA: Insufficient documentation

## 2019-09-25 DIAGNOSIS — M1990 Unspecified osteoarthritis, unspecified site: Secondary | ICD-10-CM | POA: Insufficient documentation

## 2019-09-25 SURGERY — NEURO ARTERIOGRAM
Anesthesia: Conscious Sedation | Site: Head | Laterality: Bilateral

## 2019-09-25 MED ORDER — HEPARIN (PORCINE) IN NACL 2000-0.9 UNIT/L-% IV SOLN
INTRAVENOUS | Status: AC
Start: 2019-09-25 — End: ?
  Filled 2019-09-25: qty 4000

## 2019-09-25 MED ORDER — LIDOCAINE HCL 1 % IJ SOLN
INTRAMUSCULAR | Status: AC
Start: 2019-09-25 — End: ?
  Filled 2019-09-25: qty 20

## 2019-09-25 MED ORDER — LIDOCAINE HCL 1 % IJ SOLN
INTRAMUSCULAR | Status: AC | PRN
Start: 2019-09-25 — End: 2019-09-25
  Administered 2019-09-25: 7 mL via SUBCUTANEOUS

## 2019-09-25 MED ORDER — MIDAZOLAM HCL 1 MG/ML IJ SOLN (WRAP)
INTRAMUSCULAR | Status: AC
Start: 2019-09-25 — End: ?
  Filled 2019-09-25: qty 2

## 2019-09-25 MED ORDER — SODIUM CHLORIDE 0.9 % IV SOLN
INTRAVENOUS | Status: DC
Start: 2019-09-25 — End: 2019-09-25

## 2019-09-25 MED ORDER — ONDANSETRON HCL 4 MG/2ML IJ SOLN
INTRAMUSCULAR | Status: AC
Start: 2019-09-25 — End: ?
  Filled 2019-09-25: qty 2

## 2019-09-25 MED ORDER — BUTALBITAL-APAP-CAFFEINE 50-325-40 MG PO TABS
2.00 | ORAL_TABLET | Freq: Four times a day (QID) | ORAL | 0 refills | Status: AC | PRN
Start: 2019-09-25 — End: ?

## 2019-09-25 MED ORDER — MIDAZOLAM HCL 1 MG/ML IJ SOLN (WRAP)
INTRAMUSCULAR | Status: AC | PRN
Start: 2019-09-25 — End: 2019-09-25
  Administered 2019-09-25: 0.5 mg via INTRAVENOUS

## 2019-09-25 MED ORDER — FENTANYL CITRATE (PF) 50 MCG/ML IJ SOLN (WRAP)
INTRAMUSCULAR | Status: AC | PRN
Start: 2019-09-25 — End: 2019-09-25
  Administered 2019-09-25: 25 ug via INTRAVENOUS

## 2019-09-25 MED ORDER — FENTANYL CITRATE (PF) 50 MCG/ML IJ SOLN (WRAP)
INTRAMUSCULAR | Status: AC
Start: 2019-09-25 — End: ?
  Filled 2019-09-25: qty 2

## 2019-09-25 MED ORDER — HEPARIN WASH BOWL 5 UNITS/ML SOLN (CATH LAB)
Status: AC
Start: 2019-09-25 — End: ?
  Filled 2019-09-25: qty 1000

## 2019-09-25 MED ORDER — IOHEXOL 240 MG/ML IJ SOLN
54.00 mL | Freq: Once | INTRAMUSCULAR | Status: AC | PRN
Start: 2019-09-25 — End: 2019-09-25
  Administered 2019-09-25: 54 mL

## 2019-09-25 MED ORDER — ONDANSETRON HCL 4 MG/2ML IJ SOLN
INTRAMUSCULAR | Status: AC | PRN
Start: 2019-09-25 — End: 2019-09-25
  Administered 2019-09-25: 4 mg via INTRAVENOUS

## 2019-09-25 NOTE — Sedation Documentation (Signed)
Consent per physician,In lab, pause, on table, padded, waist belt,O2 at 2lpm, prep by tech, Fred,sedation and pain medications per physician order, vital signs and LOC,and any heart rhythm changes per flow sheets

## 2019-09-25 NOTE — Sedation Documentation (Signed)
Awaiting IV

## 2019-09-25 NOTE — Discharge Instr - AVS First Page (Signed)
Ameet Chitale, MD  Department of Neurosciences  Cerebrovascular and Endovascular Neurosurgery  [address]  [place], [place] [address]  Phone: [phone] and Fax: [phone]    Post-diagnostic angiogram    RECOVERY EXPECTATIONS:  After your procedure, you can expect to have some soreness in the groin for up to 48 -72 hours.    GENERAL:  Continue to take your home medications.  Drink plenty of fluids for the next four hours; at least one 8 oz. glass every hour.    DRESSING CHANGES:  You may remove your dressing after 24 hours  Gently clean site with soap and water. Dry thoroughly  Keep the area clean and dry until healed.  Avoid tub baths, pools, hot tubs etc for 7 days or until puncture site is completely head.    EXPECTATIONS:  Slight swelling, bruising or tenderness at puncture site for 1-2 weeks    ACTIVITY:  After your diagnostic procedure, you may return to normal activity after 72 hours. Prior to 72 hours, avoid any straining or heavy lifting.    WORK:   You may return to desk work the following day and return to job that requires heavy lifting after 72 hours.    DRIVING:  No driving for 24 hours after your diagnostic procedure.     FOLLOW UP APPOINTMENTS:  If you have a been told to follow-up in our neurosurgery office, schedule your appointments with Mary.Reynolds at [phone] or e-mail [email or web address] to get them scheduled.     CONTACT INFORMATION:   For patient access or questions contact Mary.Reynolds at [phone] or e-mail [email or web address].   You may also email nurse practitioner Anguel Delapena at [email or web address].  Our office is open Monday-Friday 0800-4:30 pm.  If you contact the office after 4:30 pm you will be directed to the after-hours service, which will be an inpatient neurosurgery physician assistant.     CALL 911 if you have:  If you have significant bleeding or have a large lump at puncture site in the groin, even after applying firm  pressure for 10 minutes.  Coolness, paleness, numbness, or extreme pain in foot or leg.

## 2019-09-25 NOTE — Progress Notes (Signed)
NEUROSURGERY PROGRESS NOTE    Date Time: 09/25/19 10:36 AM  Patient Name: Pamela Booth  Consulting Attending Physician: Dr. Orlan Leavens  Covered By: Neurosurgery Team B  NEUROSURGERY TEAM B: Dr. Chauncy Lean  *CALL PRIMARY TEAM FOR ALL OTHER ISSUES*    For all neurosurgical issues, please call:  1st Call: Inpatient PA team [phone])   2nd Call: Monday-Friday, 8am-6pm: Tawni Millers, NP (pager [phone])  Backup/Nights: Residents [phone])    Call [facility] #s first. Do not page neurosurgery attending MD.       Verne Carrow, on- site interpreter, Andrey Campanile, was available during the entire encounter.  Son, Pillow, accompanies patient.    Assessment:   74 y.o. female with incidental left MCA bifurcation aneurysm. Work-up for left sided headache MRI / A showed incidental left MCA bifurcation aneurysm.   09/25/2019: DSA showed aneurysm that would be treated with clipping, however, patient is not a surgical candidate.    Plan:     Normotensive SBP 90 - 180 mm Hg  HOB flat Bedrest x 2 hours, then HOB as tolerated and up ad lib  VS and groin checks q15 mins x4, q 30 mins x2  Okay for discharge to home after PACU criteria met  Follow-up in 1 year in clinic with repeat MRA brain  Will discuss in CV conference     Referral to Neurology for headache  Rx for fioricet to pharmacy    Interim History:     09/25/19 DSA showed aneurysm that would be treated with clipping, however, patient is not a surgical candidate.       Medications:     Current Facility-Administered Medications   Medication Dose Route Frequency       No current facility-administered medications on file prior to encounter.      Current Outpatient Medications on File Prior to Encounter   Medication Sig Dispense Refill    acetaminophen (TYLENOL) 500 MG tablet Take 500 mg by mouth every 6 (six) hours as needed for Pain      butalbital-acetaminophen-caffeine (FIORICET) 50-325-40 MG per tablet Take 2 tablets by mouth every 4 (four) hours as needed      Empagliflozin  (JARDIANCE PO) Take 25 mg by mouth daily         LOSARTAN POTASSIUM PO Take 50 mg by mouth daily         omeprazole (PriLOSEC) 40 MG capsule Take 40 mg by mouth as needed         vitamin B-12 (CYANOCOBALAMIN) 1000 MCG tablet Take 1,000 mcg by mouth daily          Physical Exam:     Vitals:    09/25/19 1000   BP: 138/70   Pulse: 81   Resp: (!) 26   Temp:    SpO2: 95%       Intake and Output Summary (Last 24 hours) at Date Time  No intake or output data in the 24 hours ending 09/25/19 1036    General: The patient was well developed and well nourished.    No acute distress. Cooperative with the examination  Neck: Trachea midline, no thyromegaly  Pulmonary: normal respiratory effort. No audible wheezing.   Cardiovascular: no diaphoresis  Abdomen: Soft, non-distended  Extremities: No pedal edema, normal in color  Skin: Normal temperature, no rash  Incision:   right groin dressing c/d/i, no hematoma     Right pedal and right AT pulse 2+    Mental Status: The patient is awake, alert  and oriented to person, place, and time.    Affect is normal. Fund of knowledge appropriate.   Recent and remote memory are intact. Attention span and concentration appear normal.  No delusions or hallucinations. Language function is normal.   There is no evidence of aphasia in conversational speech.  Cranial nerves:   -CN II: Visual fields full to bedside confrontation   -CN III, IV, VI: Pupils equal, round, and reactive to light; extraocular movements intact; no ptosis                                          -CN V: Facial sensation intact in V1 through V3 distributions   -CN VII: Face symmetric   -CN VIII: Hearing intact to conversational speech   -CN IX, X: Palate elevates symmetrically; normal phonation   -CN XI: Symmetric full strength of sternocleidomastoid and trapezius muscles   -CN XII: Tongue protrudes midline  Motor: Muscle tone normal without spasticity or flaccidity. No atrophy.       No pronator drift.   Motor: Deltoid  Bicep  Grip   Right upper ext  5 5 5    Left upper ext   5 5 5       Motor: iliopsoas Quad dorsi plantar   Right lower ext    UTA - d/t fem access site   UTA - d/t fem access site 5 5   Left lower ext 5 5 5 5    Sensory:   Light touch intact.  Coordination:  No tremors  Gait: deferred. UTA - d/t fem access site   GCS 15          Labs:   No results found for: WBC, HGB, HCT, MCV, PLT  No results found for: NA, K, CL, CO2  No results found for: INR, PT    Rads:     Radiology Results (24 Hour)     Procedure Component Value Units Date/Time    Neuro Arteriogram [[id number]] Resulted: 09/25/19 0806    Order Status: Sent Updated: 09/25/19 0927            Signed by: Keitha Butte Nash Shearer, NP    Date/Time: 09/25/19 10:36 AM

## 2019-09-25 NOTE — Telephone Encounter (Signed)
Left patient's son a detailed  voicemail message.  Informing him that we need to schedule his mother's 1 yr appt. w/MRA.  If patient should call please schedule his mother 1 year follow-up w/MRA.  Thanks or transfer it to me.

## 2019-09-25 NOTE — Sedation Documentation (Signed)
Sheath placed, tolerating injections

## 2019-09-25 NOTE — Plan of Care (Signed)
Patient brought into radiology recovery post angiogram procedure.  No complaints of pain.  Puncture site clean, dry, and intact.  Vital signs stabve per EMR.  Will continue to monitor closely.

## 2019-09-25 NOTE — Sedation Documentation (Signed)
To stretcher, PERL, clear speech, to recovery after hemostasis

## 2019-09-25 NOTE — Sedation Documentation (Signed)
3D

## 2019-09-25 NOTE — Nursing Progress Note (Signed)
Pt and family educated for procedure complications, new meds, and follow up appts. Both verify understanding. Iv removed and pt taken to discharge area via wheel chair and placed in vehicle.

## 2019-09-25 NOTE — H&P (Signed)
NEUROINTERVENTIONAL RADIOLOGY H&P    Date Time: 09/25/19 7:57 AM    PROCEDURALIST COMMENTS BELOW:     Cerebral angio    INDICATIONS:   Procedure(s):  NEURO ARTERIOGRAM      PAST MEDICAL HISTORY:     Past Medical History:   Diagnosis Date    Diabetes mellitus     Hyperlipidemia     Hypertension     Osteoarthritis        PAST SURGICAL HISTORY     Past Surgical History:   Procedure Laterality Date    BREAST SURGERY Left 1995    for lump    BREAST SURGERY Right 2000    mastitis?????         REVIEW OF SYSTEMS REVIEWED:   YES  ( x )      CURRENT MEDICATION REVIEWED   YES  ( x )        ALLERGIES:     No Known Allergies      PREVIOUS REACTION TO SEDATION MEDICATIONS     NO (x )   YES ( )    PREVIOUS HIGH RADIATION DOSE CASE IN PAST 6 MONTHS?     NO (x )   YES ( )    PHYSICAL EXAM     AIRWAY CLASSIFICATION:    CLASS I   (  )     CLASS II  (  )    CLASS III  (x  )     CLASS IV  (  )    INTUBATED (  )    CARDIAC :   RRR    LUNGS:   CLEAR    ABDOMEN:   SOFT    NEURO:     normal without focal findings      LABS:   No results found for: WBC, HCT, LABPLAT, INR, PT, PTT, BUN, CREAT, GLU, K          ASA PHYSICAL STATUS   (  )  ASA 1   HEALTHY PATIENT  (  )  ASA 2   MILD SYSTEMIC ILLNESS  (  x)  ASA 3   SYSTEMIC DISEASE, NOT INCAPACITATING  (  )  ASA 4   SEVERE SYSTEMIC DISEASE, DISEASE IS CONSTANT THREAT TO                         LIFE  (  )  ASA 5   MORIBUND CONDITION, NOT EXPECTED TO LIVE >24 HOURS            IRRESPECTIVE OF PROCEDURE  (  )  E           EMERGENCY PROCEDURE       PLANNED SEDATION:   (  ) NO SEDATION  (  x) MODERATE SEDATION  (  ) DEEP SEDATION WITH ANESTHESIA      CONCLUSION:   PATIENT HAS BEEN REASSESSED IMMEDIATELY PRIOR TO THE PROCEDURE   AND IS AN APPROPRIATE CANDIDATE FOR THE PLANNED SEDATION AND   PROCEDURE.  RISKS, BENEFITS AND ALTERNATIVES TO THE PLANNED   PROCEDURE HAVE BEEN EXPLAINED TO THE PATIENT OR GUARDIAN, INCLUDING THE RISKS OF RADIATION EXPOSURE AND SEDATION.    (x  )  YES  (  )  EMERGENCY  CONSENT     The plan for neurointerventional care was discussed with the patient as well as the risks, benefits and alternatives. The risks include but are not limited to major  bleeding, possibly requiring transfusion and/or surgery for remedy, infection, permanent weakness, paralysis, coma, stroke, need for further surgery, failure to improve and even death. The patient understands these risks and wished to proceed with treatment as described. No guarantees were provided and I have been asked to proceed.    Signed by Binnie Rail.  Interventional Neuroradiology  Office number: [phone]

## 2019-09-25 NOTE — Sedation Documentation (Signed)
1610 right femoral artery sheath removed-[place] by Dr Chauncy Lean [id number]

## 2019-09-28 ENCOUNTER — Encounter: Payer: Self-pay | Admitting: Neurological Surgery

## 2019-10-02 ENCOUNTER — Telehealth: Payer: Self-pay | Admitting: Neurological Surgery

## 2019-10-02 NOTE — Telephone Encounter (Signed)
Spoke with pt who states they are doing well post angiogram. Denies headache, neuro changes, reaction to contrast.  States that groin is healed without complication of swelling, redness or pain.

## 2020-01-14 NOTE — Congregational Nurse Program (Signed)
Pt needing to fill out a medicaid application. States she needs cataract surgery in order to re new her license. Patient has no insurance nor transportation.

## 2021-03-01 ENCOUNTER — Other Ambulatory Visit: Payer: Self-pay | Admitting: Family Medicine

## 2021-03-01 DIAGNOSIS — G8929 Other chronic pain: Secondary | ICD-10-CM

## 2021-03-09 ENCOUNTER — Ambulatory Visit
Admission: RE | Admit: 2021-03-09 | Discharge: 2021-03-09 | Disposition: A | Discharge status: Home / Self Care | Payer: Self-pay | Source: Ambulatory Visit | Attending: Family Medicine | Admitting: Family Medicine

## 2021-03-09 DIAGNOSIS — M5441 Lumbago with sciatica, right side: Secondary | ICD-10-CM | POA: Insufficient documentation

## 2021-03-09 DIAGNOSIS — M5442 Lumbago with sciatica, left side: Secondary | ICD-10-CM | POA: Insufficient documentation

## 2021-03-09 DIAGNOSIS — G8929 Other chronic pain: Secondary | ICD-10-CM | POA: Insufficient documentation

## 2022-11-08 DIAGNOSIS — Z0000 Encounter for general adult medical examination without abnormal findings: Secondary | ICD-10-CM | POA: Diagnosis not present

## 2022-11-08 DIAGNOSIS — E119 Type 2 diabetes mellitus without complications: Secondary | ICD-10-CM | POA: Diagnosis not present

## 2022-11-08 DIAGNOSIS — R030 Elevated blood-pressure reading, without diagnosis of hypertension: Secondary | ICD-10-CM | POA: Diagnosis not present

## 2022-11-08 DIAGNOSIS — Z1239 Encounter for other screening for malignant neoplasm of breast: Secondary | ICD-10-CM | POA: Diagnosis not present

## 2022-11-08 DIAGNOSIS — I5032 Chronic diastolic (congestive) heart failure: Secondary | ICD-10-CM | POA: Diagnosis not present

## 2022-11-08 DIAGNOSIS — Z712 Person consulting for explanation of examination or test findings: Secondary | ICD-10-CM | POA: Diagnosis not present

## 2022-11-08 DIAGNOSIS — Z87898 Personal history of other specified conditions: Secondary | ICD-10-CM | POA: Diagnosis not present

## 2022-11-08 DIAGNOSIS — Z1389 Encounter for screening for other disorder: Secondary | ICD-10-CM | POA: Diagnosis not present

## 2022-11-19 ENCOUNTER — Other Ambulatory Visit: Payer: Self-pay | Admitting: Family Medicine

## 2022-11-19 DIAGNOSIS — Z1231 Encounter for screening mammogram for malignant neoplasm of breast: Secondary | ICD-10-CM

## 2022-11-29 ENCOUNTER — Ambulatory Visit
Admission: RE | Admit: 2022-11-29 | Discharge: 2022-11-29 | Disposition: A | Discharge status: Home / Self Care | Payer: 59 | Source: Ambulatory Visit | Attending: Family Medicine | Admitting: Family Medicine

## 2022-11-29 DIAGNOSIS — Z1231 Encounter for screening mammogram for malignant neoplasm of breast: Secondary | ICD-10-CM | POA: Insufficient documentation

## 2022-12-04 DIAGNOSIS — Z712 Person consulting for explanation of examination or test findings: Secondary | ICD-10-CM | POA: Diagnosis not present

## 2022-12-04 DIAGNOSIS — Z1389 Encounter for screening for other disorder: Secondary | ICD-10-CM | POA: Diagnosis not present

## 2022-12-04 DIAGNOSIS — M5414 Radiculopathy, thoracic region: Secondary | ICD-10-CM | POA: Diagnosis not present

## 2022-12-22 ENCOUNTER — Emergency Department
Admission: EM | Admit: 2022-12-22 | Discharge: 2022-12-22 | Disposition: A | Discharge status: Home / Self Care | Payer: 59 | Attending: Emergency Medicine | Admitting: Emergency Medicine

## 2022-12-22 ENCOUNTER — Other Ambulatory Visit: Payer: Self-pay

## 2022-12-22 ENCOUNTER — Emergency Department: Payer: 59

## 2022-12-22 DIAGNOSIS — J069 Acute upper respiratory infection, unspecified: Secondary | ICD-10-CM | POA: Diagnosis not present

## 2022-12-22 DIAGNOSIS — R059 Cough, unspecified: Secondary | ICD-10-CM | POA: Diagnosis not present

## 2022-12-22 DIAGNOSIS — J9801 Acute bronchospasm: Secondary | ICD-10-CM | POA: Insufficient documentation

## 2022-12-22 DIAGNOSIS — J029 Acute pharyngitis, unspecified: Secondary | ICD-10-CM | POA: Diagnosis not present

## 2022-12-22 DIAGNOSIS — Z1152 Encounter for screening for COVID-19: Secondary | ICD-10-CM | POA: Insufficient documentation

## 2022-12-22 LAB — COMPREHENSIVE METABOLIC PANEL
ALT: 14 U/L (ref 0–44)
AST: 21 U/L (ref 15–41)
Albumin: 4.1 g/dL (ref 3.5–5.0)
Alkaline Phosphatase: 104 U/L (ref 38–126)
Anion gap: 8 (ref 5–15)
BUN: 12 mg/dL (ref 8–23)
CO2: 25 mmol/L (ref 22–32)
Calcium: 9.2 mg/dL (ref 8.9–10.3)
Chloride: 102 mmol/L (ref 98–111)
Creatinine, Ser: 0.61 mg/dL (ref 0.44–1.00)
GFR, Estimated: >60 mL/min (ref >60)
Glucose, Bld: 157 mg/dL — ABNORMAL HIGH (ref 70–99)
Potassium: 4.1 mmol/L (ref 3.5–5.1)
Sodium: 135 mmol/L (ref 135–145)
Total Bilirubin: 0.6 mg/dL (ref 0.3–1.2)
Total Protein: 8.3 g/dL — ABNORMAL HIGH (ref 6.5–8.1)

## 2022-12-22 LAB — CBC WITH DIFFERENTIAL/PLATELET
Abs Immature Granulocytes: 0.04 10*3/uL (ref 0.00–0.07)
Basophils Absolute: 0 10*3/uL (ref 0.0–0.1)
Basophils Relative: 0 %
Eosinophils Absolute: 0.2 10*3/uL (ref 0.0–0.5)
Eosinophils Relative: 2 %
HCT: 43 % (ref 36.0–46.0)
Hemoglobin: 13.8 g/dL (ref 12.0–15.0)
Immature Granulocytes: 0 %
Lymphocytes Relative: 35 %
Lymphs Abs: 4.1 10*3/uL — ABNORMAL HIGH (ref 0.7–4.0)
MCH: 27.8 pg (ref 26.0–34.0)
MCHC: 32.1 g/dL (ref 30.0–36.0)
MCV: 86.7 fL (ref 80.0–100.0)
Monocytes Absolute: 0.8 10*3/uL (ref 0.1–1.0)
Monocytes Relative: 6 %
Neutro Abs: 6.7 10*3/uL (ref 1.7–7.7)
Neutrophils Relative %: 57 %
Platelets: 363 10*3/uL (ref 150–400)
RBC: 4.96 MIL/uL (ref 3.87–5.11)
RDW: 13.8 % (ref 11.5–15.5)
WBC: 11.8 10*3/uL — ABNORMAL HIGH (ref 4.0–10.5)
nRBC: 0 % (ref 0.0–0.2)

## 2022-12-22 LAB — TROPONIN I (HIGH SENSITIVITY): Troponin I (High Sensitivity): 5 ng/L (ref <18)

## 2022-12-22 LAB — BRAIN NATRIURETIC PEPTIDE: B Natriuretic Peptide: 21.9 pg/mL (ref 0.0–100.0)

## 2022-12-22 LAB — SARS CORONAVIRUS 2 BY RT PCR: SARS Coronavirus 2 by RT PCR: NEGATIVE

## 2022-12-22 LAB — GROUP A STREP BY PCR: Group A Strep by PCR: NOT DETECTED

## 2022-12-22 LAB — LACTIC ACID, PLASMA: Lactic Acid, Venous: 2 mmol/L (ref 0.5–1.9)

## 2022-12-22 MED ORDER — ACETAMINOPHEN 325 MG PO TABS
650.0000 mg | ORAL_TABLET | Freq: Once | ORAL | Status: DC | PRN
  Filled 2022-12-22: qty 2

## 2022-12-22 MED ORDER — PREDNISONE 20 MG PO TABS
40.0000 mg | ORAL_TABLET | Freq: Once | ORAL | Status: AC
  Administered 2022-12-22: 40 mg via ORAL
  Filled 2022-12-22: qty 2

## 2022-12-22 MED ORDER — PREDNISONE 20 MG PO TABS: 40.0000 mg | ORAL_TABLET | Freq: Every day | ORAL | 0 refills | Status: DC

## 2022-12-22 MED ORDER — AZITHROMYCIN 250 MG PO TABS: ORAL_TABLET | ORAL | 0 refills | Status: AC

## 2022-12-22 MED ORDER — AZITHROMYCIN 250 MG PO TABS: ORAL_TABLET | ORAL | 0 refills | Status: DC

## 2022-12-22 MED ORDER — ALBUTEROL SULFATE HFA 108 (90 BASE) MCG/ACT IN AERS
2.0000 | INHALATION_SPRAY | Freq: Once | RESPIRATORY_TRACT | Status: AC
  Administered 2022-12-22: 2 via RESPIRATORY_TRACT
  Filled 2022-12-22: qty 6.7

## 2022-12-22 MED ORDER — CETIRIZINE HCL 10 MG PO TABS: 10.0000 mg | ORAL_TABLET | Freq: Every day | ORAL | 0 refills | Status: DC

## 2022-12-22 MED ORDER — AEROCHAMBER PLUS FLO-VU MEDIUM MISC
1.0000 | Freq: Once | Status: AC
  Administered 2022-12-22: 1
  Filled 2022-12-22: qty 10

## 2022-12-22 MED ORDER — PREDNISONE 20 MG PO TABS: 40.0000 mg | ORAL_TABLET | Freq: Every day | ORAL | 0 refills | Status: AC

## 2022-12-22 MED ORDER — CETIRIZINE HCL 10 MG PO TABS: 10.0000 mg | ORAL_TABLET | Freq: Every day | ORAL | 0 refills | Status: AC

## 2022-12-22 NOTE — ED Provider Notes (Signed)
[facility] Provider Note    Event Date/Time   First MD Initiated Contact with Patient 12/22/22 1208     (approximate)   History   Headache and Cough (Low SpO2/)   HPI  Hayley Mitchell is a 77 y.o. female  here with headache, nasal drainage and sore throat x 2-3 weeks. Pt had been seen by PCP and rxed abx and allergy meds but states it has not been improving. She reports she has had a cough, slight wheezing, and nasal congestion for 2 weeks. Took abx initially without major improvement. She's had associated aural fullness, postnasal drainage, no tinnitus. No h/o pulmonary issues. No chest pain.  History obtained w/ interpreter.       Physical Exam   Triage Vital Signs: ED Triage Vitals  Enc Vitals Group     BP 12/22/22 1137 (!) 160/99     Pulse Rate 12/22/22 1137 86     Resp 12/22/22 1137 20     Temp 12/22/22 1137 98.6 F (37 C)     Temp src --      SpO2 12/22/22 1137 91 %     Weight 12/22/22 1138 240 lb (108.9 kg)     Height 12/22/22 1138 5\' 3"  (1.6 m)     Head Circumference --      Peak Flow --      Pain Score 12/22/22 1137 8     Pain Loc --      Pain Edu? --      Excl. in GC? --     Most recent vital signs: Vitals:   12/22/22 1244 12/22/22 1330  BP:  (!) 155/70  Pulse: 74 77  Resp: (!) 21 15  Temp:    SpO2: 100% 100%     General: Awake, no distress.  CV:  Good peripheral perfusion. RRR. No murmurs. Resp:  Scant expiratory wheezes noted, otherwise unremarkable. Abd:  No distention. No tenderness. No rebound or guarding. Other:  Mildly dry MM. Non-toxic.   ED Results / Procedures / Treatments   Labs (all labs ordered are listed, but only abnormal results are displayed) Labs Reviewed  CBC WITH DIFFERENTIAL/PLATELET - Abnormal; Notable for the following components:      Result Value   WBC 11.8 (*)    Lymphs Abs 4.1 (*)    All other components within normal limits  COMPREHENSIVE METABOLIC PANEL - Abnormal; Notable for  the following components:   Glucose, Bld 157 (*)    Total Protein 8.3 (*)    All other components within normal limits  LACTIC ACID, PLASMA - Abnormal; Notable for the following components:   Lactic Acid, Venous 2.0 (*)    All other components within normal limits  SARS CORONAVIRUS 2 BY RT PCR  GROUP A STREP BY PCR  CULTURE, BLOOD (SINGLE)  BRAIN NATRIURETIC PEPTIDE  TROPONIN I (HIGH SENSITIVITY)     EKG    RADIOLOGY CXR: No active disease   I also independently reviewed and agree with radiologist interpretations.   PROCEDURES:  Critical Care performed: No   MEDICATIONS ORDERED IN ED: Medications  predniSONE (DELTASONE) tablet 40 mg (40 mg Oral Given 12/22/22 1301)  albuterol (VENTOLIN HFA) 108 (90 Base) MCG/ACT inhaler 2 puff (2 puffs Inhalation Given 12/22/22 1414)  AeroChamber Plus Flo-Vu Medium MISC 1 each (1 each Other Given 12/22/22 1416)     IMPRESSION / MDM / ASSESSMENT AND PLAN / ED COURSE  I reviewed the triage vital signs and the  nursing notes.                              Differential diagnosis includes, but is not limited to, URI, bronchospasm/RAD, CAP, pharyngitis, sinusitis, pulmonary edema, allergies  Patient's presentation is most consistent with acute presentation with potential threat to life or bodily function.  The patient is on the cardiac monitor to evaluate for evidence of arrhythmia and/or significant heart rate changes  77 yo very well appearing female here with cough, congestion, general fatigue. Sx have been ongoing x 2 weeks. Mild wheeze noted on exam here. Suspect likely underlying RAD/bronchospasm vs atypical PNA. No signs of CHF clinically and BNP is normal. EKG nonischemic and trop negative, doubt ACS. COVID negative. GAS negative. CBC with mild likely reactive leukocytosis but no anemia. CMP unremarkable. LA 2.0, likely from albuterol use with wheezing and mild dehydration. She is tolerating PO here.  Will treat for possible underlying  bronchospasm/COPD vs atypical PNA with azithro, steroids, and antihistamines for allergic component. She is otherwise well appearing, ambulatory without hypoxia, and satting 98% at rest on RA in the room.    FINAL CLINICAL IMPRESSION(S) / ED DIAGNOSES   Final diagnoses:  Upper respiratory tract infection, unspecified type  Bronchospasm     Rx / DC Orders   ED Discharge Orders          Ordered    predniSONE (DELTASONE) 20 MG tablet  Daily,   Status:  Discontinued        12/22/22 1434    predniSONE (DELTASONE) 20 MG tablet  Daily        12/22/22 1511    azithromycin (ZITHROMAX Z-PAK) 250 MG tablet  Status:  Discontinued        12/22/22 1434    cetirizine (ZYRTEC ALLERGY) 10 MG tablet  Daily,   Status:  Discontinued        12/22/22 1434    azithromycin (ZITHROMAX Z-PAK) 250 MG tablet        12/22/22 1511    cetirizine (ZYRTEC ALLERGY) 10 MG tablet  Daily        12/22/22 1511             Note:  This document was prepared using Dragon voice recognition software and may include unintentional dictation errors.   Shaune Pollack, MD 12/22/22 2003

## 2022-12-22 NOTE — ED Notes (Signed)
Pt ambulated in the hall 100 feet with walker and pulse ox. O2 sats lowest was 91. HR went up to 98. Pt back in bed. Monitor on.

## 2022-12-22 NOTE — ED Notes (Signed)
Provider Dr Erma Heritage notified of Lactic acid of 2.0.

## 2022-12-22 NOTE — ED Notes (Signed)
Pt O2 sats at 100%. Took off nasal cannula to get a room air saturation.

## 2022-12-22 NOTE — Discharge Instructions (Signed)
Tome los esteroides y antibiticos segn lo recetado.  Tome el medicamento para la alergia diariamente durante las prximas semanas.  Use el inhalador de albuterol 2 inhalaciones cada 4 a 6 horas para la tos/sibilancias

## 2022-12-22 NOTE — ED Triage Notes (Signed)
Pt presents with HA, otalgia, nasal drainage, and sore throat x 3 weeks. Pt saw PCP and was given abx and allergy medication, but pt reports no improvement.

## 2022-12-23 LAB — CULTURE, BLOOD (SINGLE)

## 2022-12-24 LAB — CULTURE, BLOOD (SINGLE)

## 2022-12-27 LAB — CULTURE, BLOOD (SINGLE)
Culture: NO GROWTH
Special Requests: ADEQUATE

## 2023-07-01 ENCOUNTER — Inpatient Hospital Stay: Payer: No Typology Code available for payment source | Admitting: Rehabilitative and Restorative Service Providers"

## 2023-07-04 ENCOUNTER — Inpatient Hospital Stay: Payer: No Typology Code available for payment source | Admitting: Rehabilitative and Restorative Service Providers"

## 2023-07-08 ENCOUNTER — Inpatient Hospital Stay: Payer: No Typology Code available for payment source | Admitting: Rehabilitative and Restorative Service Providers"

## 2023-07-11 ENCOUNTER — Inpatient Hospital Stay: Payer: No Typology Code available for payment source | Admitting: Rehabilitative and Restorative Service Providers"

## 2023-07-15 ENCOUNTER — Inpatient Hospital Stay: Payer: No Typology Code available for payment source | Admitting: Rehabilitative and Restorative Service Providers"

## 2023-07-18 ENCOUNTER — Inpatient Hospital Stay: Payer: No Typology Code available for payment source | Admitting: Rehabilitative and Restorative Service Providers"

## 2023-07-22 ENCOUNTER — Inpatient Hospital Stay: Payer: No Typology Code available for payment source | Admitting: Rehabilitative and Restorative Service Providers"

## 2023-07-25 ENCOUNTER — Inpatient Hospital Stay: Payer: No Typology Code available for payment source | Admitting: Rehabilitative and Restorative Service Providers"
